# Patient Record
Sex: Female | Born: 1945 | Race: White | Hispanic: Yes | Marital: Married | Smoking: Former smoker
Health system: Southern US, Community
[De-identification: ages and names within clinical notes are randomized; demographics above are authoritative.]

## PROBLEM LIST (undated history)

## (undated) DIAGNOSIS — Z780 Asymptomatic menopausal state: Secondary | ICD-10-CM

## (undated) DIAGNOSIS — E785 Hyperlipidemia, unspecified: Secondary | ICD-10-CM

## (undated) DIAGNOSIS — J302 Other seasonal allergic rhinitis: Secondary | ICD-10-CM

## (undated) HISTORY — DX: Hyperlipidemia, unspecified: E78.5

## (undated) HISTORY — DX: Other seasonal allergic rhinitis: J30.2

## (undated) HISTORY — DX: Asymptomatic menopausal state: Z78.0

## (undated) HISTORY — PX: THYROIDECTOMY: SHX17

## (undated) HISTORY — PX: MASS EXCISION: SHX2000

---

## 1995-06-30 DIAGNOSIS — Z780 Asymptomatic menopausal state: Secondary | ICD-10-CM

## 1995-06-30 HISTORY — DX: Asymptomatic menopausal state: Z78.0

## 1998-01-22 ENCOUNTER — Ambulatory Visit (HOSPITAL_COMMUNITY): Admission: RE | Admit: 1998-01-22 | Discharge: 1998-01-22 | Payer: Self-pay | Admitting: Obstetrics

## 1999-01-30 ENCOUNTER — Ambulatory Visit (HOSPITAL_COMMUNITY): Admission: RE | Admit: 1999-01-30 | Discharge: 1999-01-30 | Payer: Self-pay | Admitting: Obstetrics

## 1999-07-31 ENCOUNTER — Encounter: Admission: RE | Admit: 1999-07-31 | Discharge: 1999-10-29 | Payer: Self-pay | Admitting: Gynecology

## 1999-12-19 ENCOUNTER — Other Ambulatory Visit: Admission: RE | Admit: 1999-12-19 | Discharge: 1999-12-19 | Payer: Self-pay | Admitting: Gynecology

## 2000-02-27 ENCOUNTER — Ambulatory Visit (HOSPITAL_COMMUNITY): Admission: RE | Admit: 2000-02-27 | Discharge: 2000-02-27 | Payer: Self-pay | Admitting: Obstetrics

## 2000-12-20 ENCOUNTER — Other Ambulatory Visit: Admission: RE | Admit: 2000-12-20 | Discharge: 2000-12-20 | Payer: Self-pay | Admitting: Gynecology

## 2001-03-18 ENCOUNTER — Encounter: Payer: Self-pay | Admitting: Gynecology

## 2001-03-18 ENCOUNTER — Ambulatory Visit (HOSPITAL_COMMUNITY): Admission: RE | Admit: 2001-03-18 | Discharge: 2001-03-18 | Payer: Self-pay | Admitting: Obstetrics

## 2002-02-13 ENCOUNTER — Other Ambulatory Visit: Admission: RE | Admit: 2002-02-13 | Discharge: 2002-02-13 | Payer: Self-pay | Admitting: Gynecology

## 2002-03-21 ENCOUNTER — Encounter: Payer: Self-pay | Admitting: Gynecology

## 2002-03-21 ENCOUNTER — Ambulatory Visit (HOSPITAL_COMMUNITY): Admission: RE | Admit: 2002-03-21 | Discharge: 2002-03-21 | Payer: Self-pay | Admitting: Gynecology

## 2003-02-19 ENCOUNTER — Other Ambulatory Visit: Admission: RE | Admit: 2003-02-19 | Discharge: 2003-02-19 | Payer: Self-pay | Admitting: Gynecology

## 2003-03-26 ENCOUNTER — Ambulatory Visit (HOSPITAL_COMMUNITY): Admission: RE | Admit: 2003-03-26 | Discharge: 2003-03-26 | Payer: Self-pay | Admitting: Gynecology

## 2003-03-26 ENCOUNTER — Encounter: Payer: Self-pay | Admitting: Gynecology

## 2004-03-10 ENCOUNTER — Other Ambulatory Visit: Admission: RE | Admit: 2004-03-10 | Discharge: 2004-03-10 | Payer: Self-pay | Admitting: Gynecology

## 2004-03-31 ENCOUNTER — Ambulatory Visit (HOSPITAL_COMMUNITY): Admission: RE | Admit: 2004-03-31 | Discharge: 2004-03-31 | Payer: Self-pay | Admitting: Gynecology

## 2005-03-11 ENCOUNTER — Other Ambulatory Visit: Admission: RE | Admit: 2005-03-11 | Discharge: 2005-03-11 | Payer: Self-pay | Admitting: Gynecology

## 2005-03-31 ENCOUNTER — Ambulatory Visit: Payer: Self-pay | Admitting: General Practice

## 2006-03-23 ENCOUNTER — Other Ambulatory Visit: Admission: RE | Admit: 2006-03-23 | Discharge: 2006-03-23 | Payer: Self-pay | Admitting: Gynecology

## 2006-04-14 ENCOUNTER — Ambulatory Visit: Payer: Self-pay | Admitting: General Practice

## 2006-07-20 ENCOUNTER — Ambulatory Visit: Payer: Self-pay | Admitting: Internal Medicine

## 2006-08-04 ENCOUNTER — Ambulatory Visit: Payer: Self-pay | Admitting: Internal Medicine

## 2007-01-06 ENCOUNTER — Ambulatory Visit: Payer: Self-pay | Admitting: Family Medicine

## 2007-01-06 DIAGNOSIS — M25519 Pain in unspecified shoulder: Secondary | ICD-10-CM

## 2007-03-28 ENCOUNTER — Encounter (INDEPENDENT_AMBULATORY_CARE_PROVIDER_SITE_OTHER): Payer: Self-pay | Admitting: Family Medicine

## 2007-03-28 ENCOUNTER — Other Ambulatory Visit: Admission: RE | Admit: 2007-03-28 | Discharge: 2007-03-28 | Payer: Self-pay | Admitting: Gynecology

## 2007-04-19 ENCOUNTER — Ambulatory Visit: Payer: Self-pay | Admitting: General Practice

## 2007-11-11 ENCOUNTER — Emergency Department (HOSPITAL_COMMUNITY): Admission: EM | Admit: 2007-11-11 | Discharge: 2007-11-11 | Payer: Self-pay | Admitting: Emergency Medicine

## 2007-11-17 ENCOUNTER — Encounter: Admission: RE | Admit: 2007-11-17 | Discharge: 2007-11-17 | Payer: Self-pay | Admitting: Orthopedic Surgery

## 2008-03-28 ENCOUNTER — Ambulatory Visit: Payer: Self-pay | Admitting: Gynecology

## 2008-03-28 ENCOUNTER — Encounter: Payer: Self-pay | Admitting: Gynecology

## 2008-03-28 ENCOUNTER — Other Ambulatory Visit: Admission: RE | Admit: 2008-03-28 | Discharge: 2008-03-28 | Payer: Self-pay | Admitting: Gynecology

## 2008-04-24 ENCOUNTER — Ambulatory Visit: Payer: Self-pay | Admitting: General Practice

## 2008-04-30 ENCOUNTER — Ambulatory Visit: Payer: Self-pay | Admitting: Gynecology

## 2009-04-23 ENCOUNTER — Ambulatory Visit: Payer: Self-pay | Admitting: Gynecology

## 2009-04-23 ENCOUNTER — Encounter: Payer: Self-pay | Admitting: Gynecology

## 2009-04-23 ENCOUNTER — Other Ambulatory Visit: Admission: RE | Admit: 2009-04-23 | Discharge: 2009-04-23 | Payer: Self-pay | Admitting: Gynecology

## 2009-04-30 ENCOUNTER — Ambulatory Visit: Payer: Self-pay | Admitting: General Practice

## 2009-05-03 ENCOUNTER — Ambulatory Visit: Payer: Self-pay | Admitting: Gynecology

## 2009-07-26 IMAGING — CR DG FOOT COMPLETE 3+V*L*
3 series · 3 of 3 positions shown · non-contrast
Comparison: None.

CLINICAL DATA: 7-year-old female with fall and left foot injury.
Severe pain.

LEFT FOOT - COMPLETE 3+ VIEW

[t foot ap left]
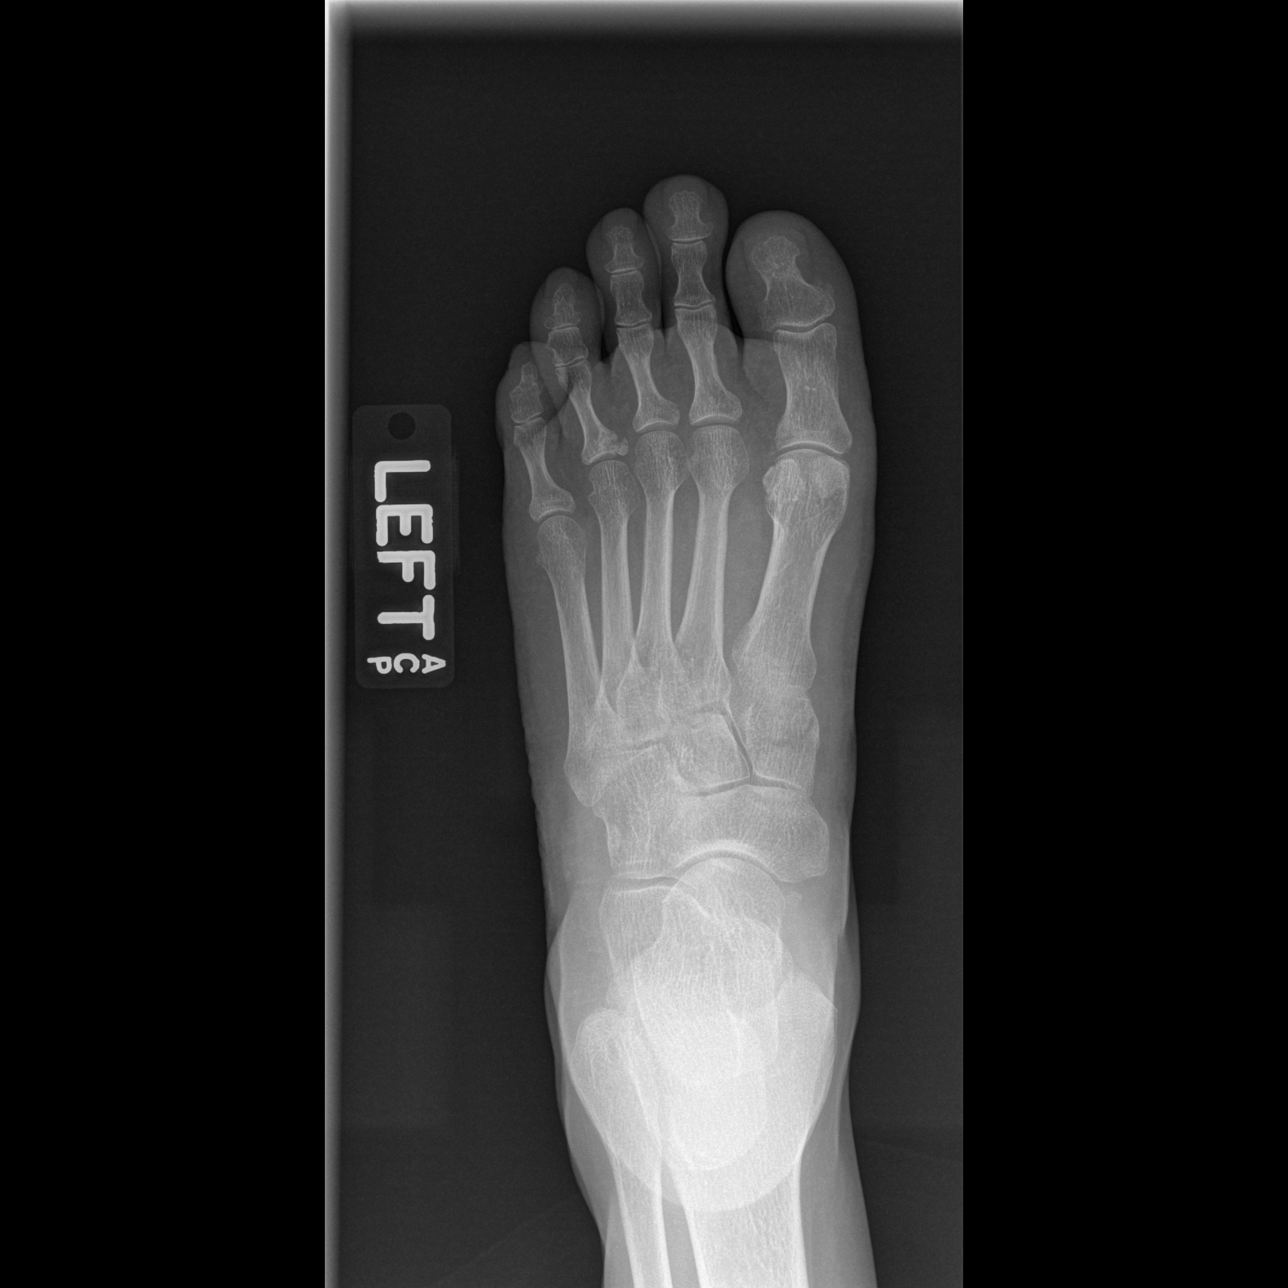

[t foot oblique left]
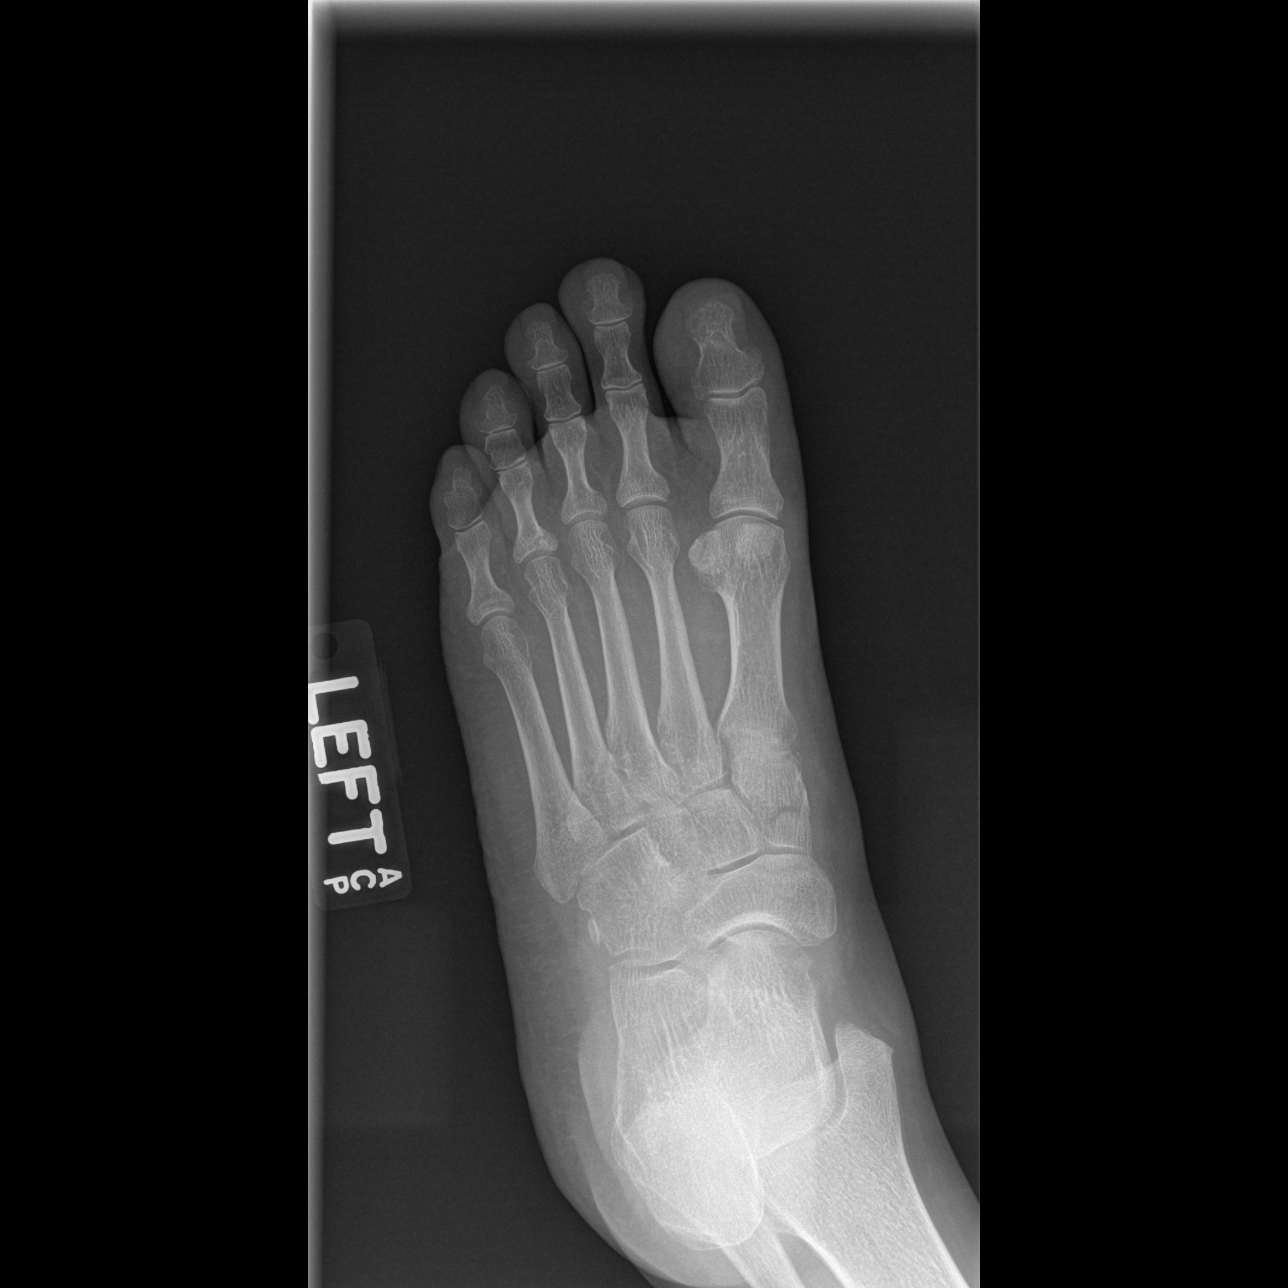

[t foot lat left]
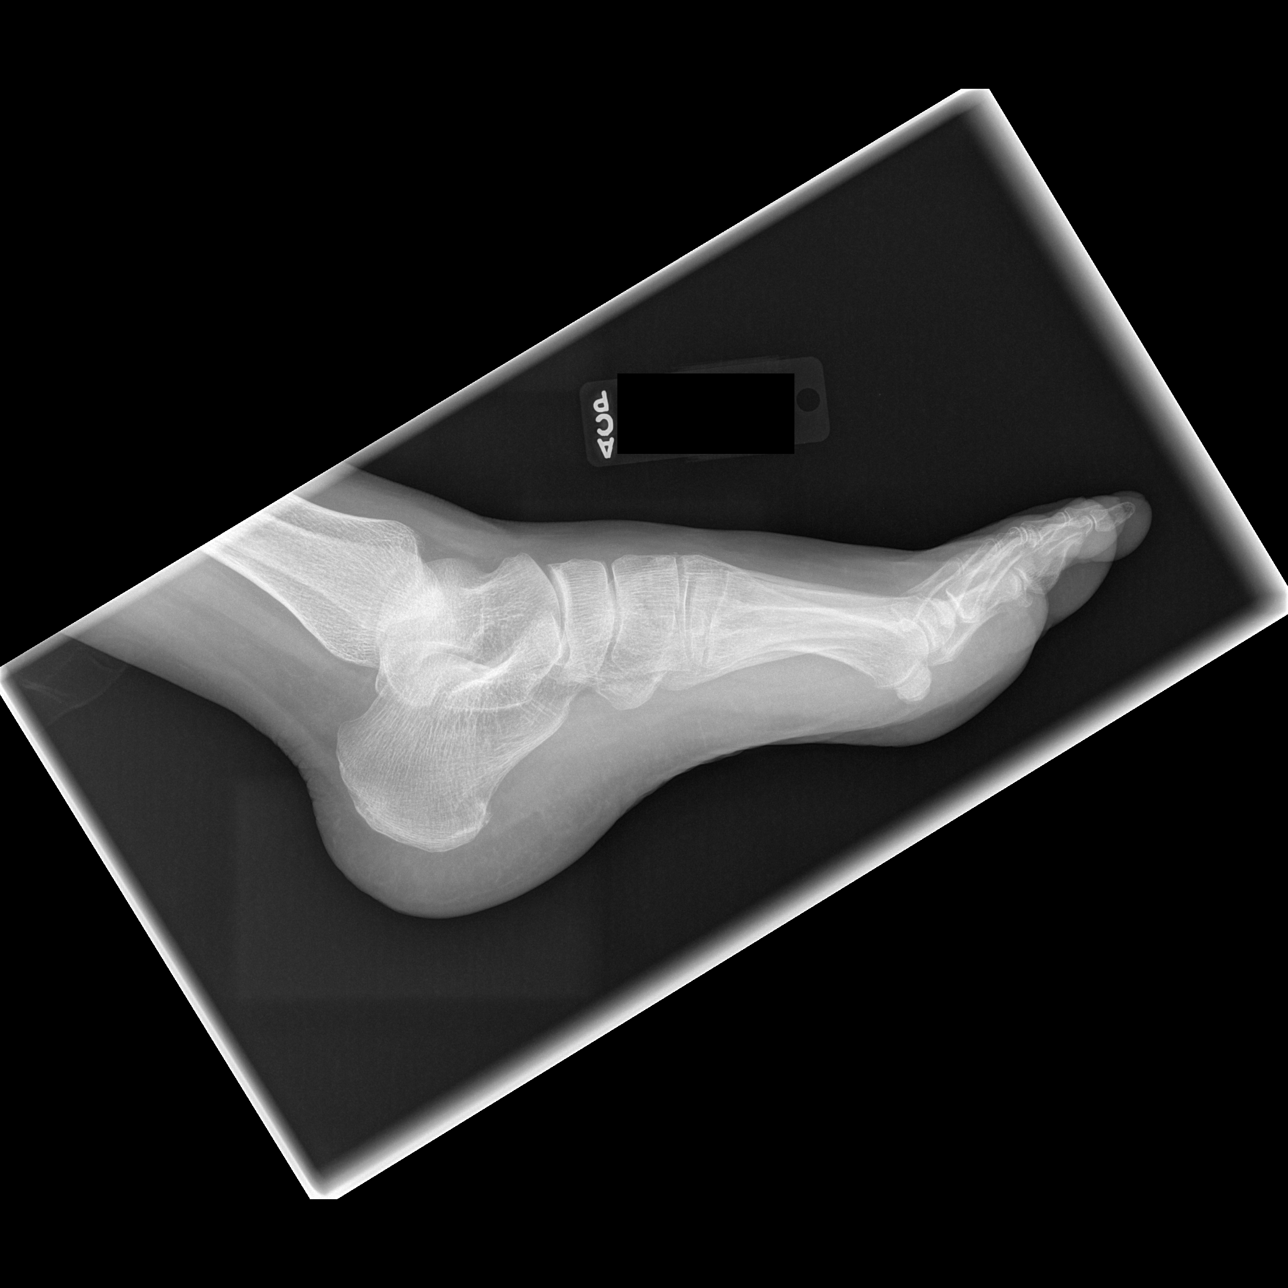

[3 of 3 positions shown; findings below may reference images not displayed]

FINDINGS: Mildly displaced fracture of the base of the fourth
proximal phalanx with intra-articular extension.  Suspicious
curvilinear lucency traverses the medial cuneiform on the frontal
and oblique views but does not appear to extend to the cortical
surface on either.  Incidental accessory ossicle adjacent to the
cuboid.  Calcaneus appears intact.  No additional fracture or
dislocation seen.
IMPRESSION: 1.  Mildly displaced fracture of the medial base of the fourth
proximal phalanx with intra-articular extension.
2.  Curvilinear lucency of the medial cuneiform does not reach the
cortical surface and is probably artifactual or physiologic.
Correlate for point tenderness in this region which if present
would suggest this is a nondisplaced fracture.

## 2009-08-01 IMAGING — CT CT EXTREM LOW W/O CM*L*
2 of 4 series · 4 of 14 positions shown, 5 images · non-contrast
Comparison: Plain films left foot 11/11/2007.

December 16, 2007 – Duplicate copy for exam association in [facility]. No change to original report.
CLINICAL DATA: Foot pain for 1 week after fall.

 CT LEFT FOOT.
TECHNIQUE: Multidetector CT imaging of the left foot was performed
 according to the standard protocol without intravenous contrast.
 Multiplanar CT image reconstructions were also generated.

[Series 4: left foot/detail · axial · 0.35mm/px · z∈[-214,-144]mm · 2 of 86 slices shown, 3 images]
[im 29/86  soft-tissue]
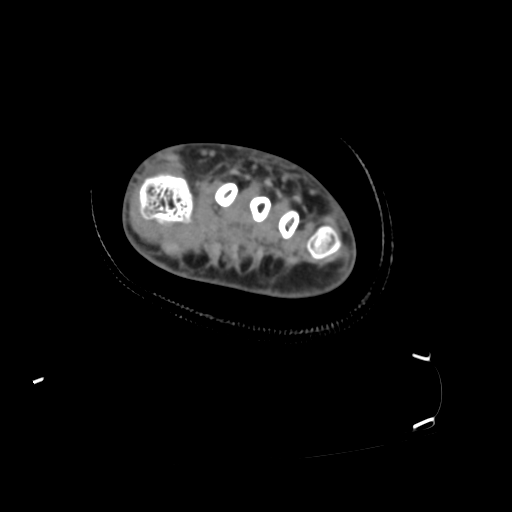
[im 29/86  bone]
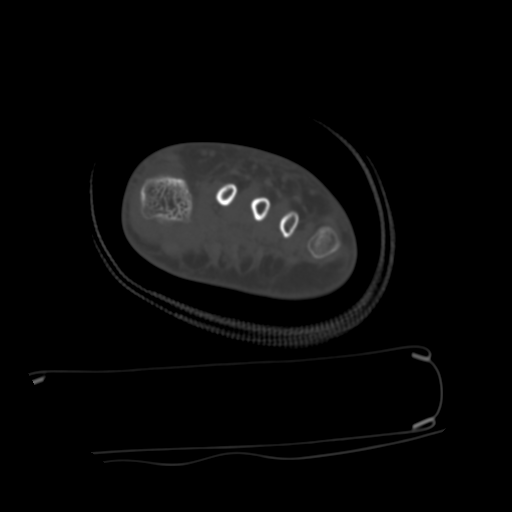
[im 57/86  bone]
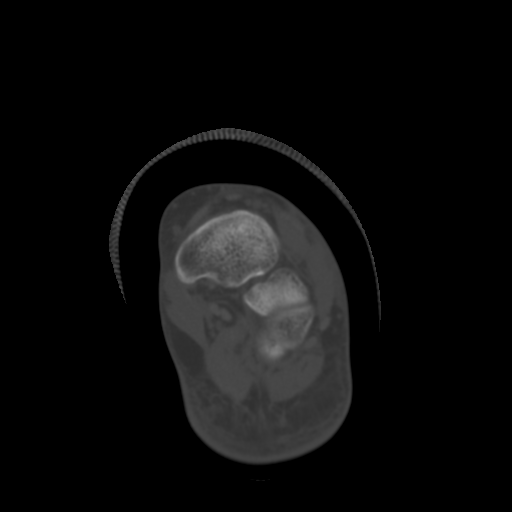

[Series 602: sagittal bone · sagittal · 0.42mm/px · 2 of 95 slices shown]
[im 32/95  bone]
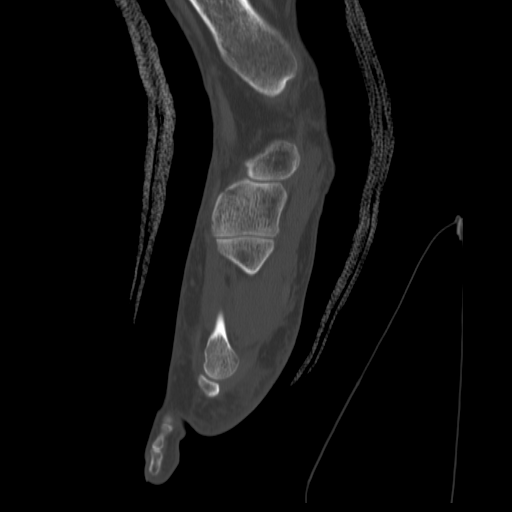
[im 63/95  bone]
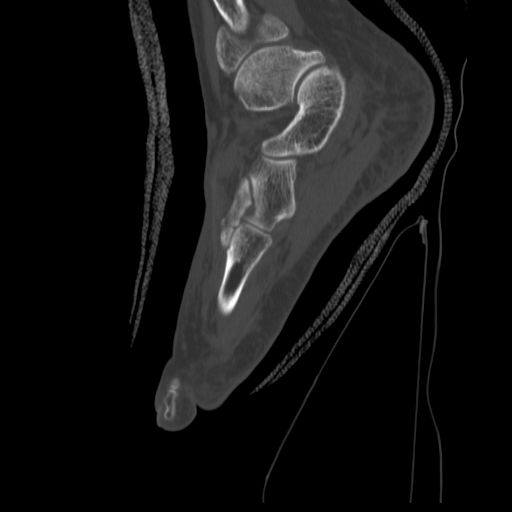

[4 of 14 positions shown; findings below may reference images not displayed]

FINDINGS: The patient has a nondisplaced fracture through the
 medial cuneiform. Fracture extends from the proximal aspect of the
 bone superiorly anteroinferiorly exiting at the inferior most
 margin of the articular surface of the medial cuneiform at its
 articulation of the first metatarsal.

 The patient also has a nondisplaced fracture through the base of
 the third metatarsal involving the medial and inferior cortex.
 Tiny avulsion fracture is also seen off the dorsal aspect of the
 base of the third metatarsal. This fracture is incomplete. No
 other discrete fracture is identified.

 There are small calcifications noted between the proximal shafts
 of the second and third metatarsals which are of doubtful clinical
 significance. No osteochondral lesion is seen the talar dome.
 Imaged muscles and tendons are unremarkable. There is some
 subcutaneous edema, which appears mild, over the dorsum of the
 foot.
IMPRESSION: 1. Fractures through the medial cuneiform and bases of the third
 and fourth metatarsals as described.

## 2010-03-26 ENCOUNTER — Ambulatory Visit: Payer: Self-pay | Admitting: Gynecology

## 2010-04-22 ENCOUNTER — Ambulatory Visit: Payer: Self-pay | Admitting: General Practice

## 2010-06-04 ENCOUNTER — Ambulatory Visit: Payer: Self-pay | Admitting: Gynecology

## 2010-06-12 ENCOUNTER — Ambulatory Visit: Payer: Self-pay | Admitting: Gynecology

## 2010-06-12 ENCOUNTER — Other Ambulatory Visit
Admission: RE | Admit: 2010-06-12 | Discharge: 2010-06-12 | Payer: Self-pay | Source: Home / Self Care | Admitting: Gynecology

## 2010-06-24 ENCOUNTER — Ambulatory Visit (HOSPITAL_COMMUNITY)
Admission: RE | Admit: 2010-06-24 | Discharge: 2010-06-24 | Payer: Self-pay | Source: Home / Self Care | Attending: Gynecology | Admitting: Gynecology

## 2010-09-19 ENCOUNTER — Ambulatory Visit (INDEPENDENT_AMBULATORY_CARE_PROVIDER_SITE_OTHER): Payer: BC Managed Care – PPO

## 2010-09-19 DIAGNOSIS — M810 Age-related osteoporosis without current pathological fracture: Secondary | ICD-10-CM

## 2010-09-19 DIAGNOSIS — J069 Acute upper respiratory infection, unspecified: Secondary | ICD-10-CM

## 2011-04-07 ENCOUNTER — Other Ambulatory Visit: Payer: Self-pay | Admitting: *Deleted

## 2011-04-07 DIAGNOSIS — M810 Age-related osteoporosis without current pathological fracture: Secondary | ICD-10-CM

## 2011-04-07 MED ORDER — DENOSUMAB 60 MG/ML ~~LOC~~ SOLN
60.0000 mg | Freq: Once | SUBCUTANEOUS | Status: AC
  Administered 2011-04-10: 60 mg via SUBCUTANEOUS

## 2011-04-10 ENCOUNTER — Ambulatory Visit (INDEPENDENT_AMBULATORY_CARE_PROVIDER_SITE_OTHER): Payer: BC Managed Care – PPO | Admitting: Anesthesiology

## 2011-04-10 DIAGNOSIS — M810 Age-related osteoporosis without current pathological fracture: Secondary | ICD-10-CM

## 2011-04-21 ENCOUNTER — Ambulatory Visit: Payer: Self-pay

## 2011-06-08 ENCOUNTER — Encounter: Payer: Self-pay | Admitting: Anesthesiology

## 2011-06-16 ENCOUNTER — Ambulatory Visit (INDEPENDENT_AMBULATORY_CARE_PROVIDER_SITE_OTHER): Payer: BC Managed Care – PPO | Admitting: Gynecology

## 2011-06-16 ENCOUNTER — Encounter: Payer: Self-pay | Admitting: Gynecology

## 2011-06-16 ENCOUNTER — Other Ambulatory Visit (HOSPITAL_COMMUNITY)
Admission: RE | Admit: 2011-06-16 | Discharge: 2011-06-16 | Disposition: A | Discharge status: Home / Self Care | Payer: BC Managed Care – PPO | Source: Ambulatory Visit | Attending: Gynecology | Admitting: Gynecology

## 2011-06-16 DIAGNOSIS — Z01419 Encounter for gynecological examination (general) (routine) without abnormal findings: Secondary | ICD-10-CM | POA: Insufficient documentation

## 2011-06-16 DIAGNOSIS — Z1211 Encounter for screening for malignant neoplasm of colon: Secondary | ICD-10-CM

## 2011-06-16 NOTE — Progress Notes (Signed)
Ashley Estrada 1946-05-08 [id number]   History:    65 y.o.  for annual exam with known history of osteoporosis. Patient was started on Prolia in November 2010 she did not get her second shot until March of 2012 and her third shot was in September of 2012. Her last bone density December 2011 demonstrated on significant decrease in the AP spine BMD but significant increase in the right and left femoral neck. Dr. Lerry Liner has been her primary physician which was treated for hypercholesterolemia for she will have been placed on Crestor although patient has a compliance issue and only took it for a few weeks. All her labs were drawn at work on October 29 which included a random blood sugar competence metabolic panel lipid profile thyroid function tests CBC all normal her hemoglobin A1c was 6.0. She is currently exercising. Last mammogram October 2011. Colonoscopy up-to-date.  Past medical history,surgical history, family history and social history were all reviewed and documented in the EPIC chart.  Gynecologic History No LMP recorded. Patient is postmenopausal. Contraception: none Last Pap: 2011. Results were: normal Last mammogram: 2012 Results were: normal  Obstetric History OB History    Grav Para Term Preterm Abortions TAB SAB Ect Mult Living   3 3 3       3      # Outc Date GA Lbr Len/2nd Wgt Sex Del Anes PTL Lv   1 TRM     M SVD  No Yes   2 TRM     M SVD  No Yes   3 TRM     F SVD  No Yes       ROS:  Was performed and pertinent positives and negatives are included in the history.  Exam: chaperone present  BP 120/78  Ht 4' 11.25" (1.505 m)  Wt 139 lb 8 oz (63.277 kg)  BMI 27.94 kg/m2  Body mass index is 27.94 kg/(m^2).  General appearance : Well developed well nourished female. No acute distress HEENT: Neck supple, trachea midline, no carotid bruits, no thyroidmegaly Lungs: Clear to auscultation, no rhonchi or wheezes, or rib retractions  Heart: Regular rate and  rhythm, no murmurs or gallops Breast:Examined in sitting and supine position were symmetrical in appearance, no palpable masses or tenderness,  no skin retraction, no nipple inversion, no nipple discharge, no skin discoloration, no axillary or supraclavicular lymphadenopathy Abdomen: no palpable masses or tenderness, no rebound or guarding Extremities: no edema or skin discoloration or tenderness  Pelvic:  Bartholin, Urethra, Skene Glands: Within normal limits             Vagina: No gross lesions or discharge  Cervix: No gross lesions or discharge  Uterus  anteverted, normal size, shape and consistency, non-tender and mobile  Adnexa  Without masses or tenderness  Anus and perineum  normal   Rectovaginal  normal sphincter tone without palpated masses or tenderness             Hemoccult cards presented for her to submit to the office at a later date  Patient had stated that she been placed on antibiotics recently for sinus infection but discontinued after a few days because an upset her stomach and she noted some blood in her stool but his been over a week she has had none of the symptoms. For this reason the fecal occult blood cords were provided for her to submit to the office later next week.     Assessment/Plan:  65 y.o. female for annual  exam unremarkable. Patient to continue to engage in weightbearing exercises to slow down her osteoporosis. She was reminded that in March she needs to return to the office for her next Prolia injection. She was reminded importance of calcium and vitamin D. I provided her with information on exercise and diet. Will follow with a bone density study next year. She declined flu vaccine. Will followup in one year or when necessary. Pap smear done today.     [name] MD, 9:46 AM 06/16/2011

## 2011-06-16 NOTE — Progress Notes (Signed)
Addended byCammie Mcgee T on: 06/16/2011 01:05 PM   Modules accepted: Orders

## 2011-06-16 NOTE — Patient Instructions (Signed)
Ejercicios para perder peso (Exercise to Lose Weight) La actividad fsica y una dieta saludable ayudan a perder peso. El mdico podr sugerirle ejercicios especficos. IDEAS Y CONSEJOS PARA HACER EJERCICIOS  Elija opciones econmicas que disfrute hacer , como caminar, andar en bicicleta o los vdeos para ejercitarse.   Utilice las escaleras en lugar del ascensor.   Camine durante la hora del almuerzo.   Estacione el auto lejos del lugar de trabajo o estudio.   Concurra a un gimnasio o tome clases de gimnasia.   Comience con 5  10 minutos de actividad fsica por da. Ejercite hasta 30 minutos, 4 a 6 das por semana.   Utilice zapatos que tengan un buen soporte y ropas cmodas.   Elongue antes y despus de ejercitar.   Ejercite hasta que aumente la respiracin y el corazn palpite rpido.   Beba agua extra cuando ejercite.   No haga ejercicio hasta lastimarse, sentirse mareado o que le falte mucho el aire.  La actividad fsica puede quemar alrededor de 150 caloras.  Correr 20 cuadras en 15 minutos.   Jugar vley durante 45 a 60 minutos.   Limpiar y encerar el auto durante 45 a 60 minutos.   Jugar ftbol americano de toque.   Caminar 25 cuadras en 35 minutos.   Empujar un cochecito 20 cuadras en 30 minutos.   Jugar baloncesto durante 30 minutos.   Rastrillar hojas secas durante 30 minutos.   Andar en bicicleta 80 cuadras en 30 minutos.   Caminar 30 cuadras en 30 minutos.   Bailar durante 30 minutos.   Quitar la nieve con una pala durante 15 minutos.   Nadar vigorosamente durante 20 minutos.   Subir escaleras durante 15 minutos.   Andar en bicicleta 60 cuadras durante 15 minutos.   Arreglar el jardn entre 30 y 45 minutos.   Saltar a la soga durante 15 minutos.   Limpiar vidrios o pisos durante 45 a 60 minutos.  Document Released: 09/19/2010 Document Revised: 02/25/2011 [employer] Patient Information 2012 [employer].                                                   Control del colesterol  Los niveles de colesterol en el organismo estn determinados significativamente por su dieta. Los niveles de colesterol tambin se relacionan con la enfermedad cardaca. El material que sigue ayuda a explicar esta relacin y a analizar qu puede hacer para mantener su corazn sano. No todo el colesterol es malo. Las lipoprotenas de baja densidad (LDL) forman el colesterol "malo". El colesterol malo puede ocasionar depsitos de grasa que se acumulan en el interior de las arterias. Las lipoprotenas de alta densidad (HDL) es el colesterol "bueno". Ayuda a remover el colesterol LDL "malo" de la sangre. El colesterol es un factor de riesgo muy importante para la enfermedad cardaca. Otros factores de riesgo son la hipertensin arterial, el hbito de fumar, el estrs, la herencia y el peso.   El msculo cardaco obtiene el suministro de sangre a travs de las arterias coronarias. Si su colesterol LDL ("malo") est elevado y el HDL ("bueno") es bajo, tiene un factor de riesgo para que se formen depsitos de grasa en las arterias coronarias (los vasos sanguneos que suministran sangre al corazn). Esto hace que haya menos lugar para que la sangre circule. Sin la suficiente sangre y   oxgeno, el msculo cardaco no puede funcionar correctamente, y usted podr sentir dolores en el pecho (angina pectoris). Cuando una arteria coronaria se cierra completamente, una parte del msculo cardaco puede morir (infarto de miocardio).  CONTROL DEL COLESTEROL Cuando el profesional que lo asiste enva la sangre al laboratorio para conocer el nivel de colesterol, puede realizarle tambin un perfil completo de los lpidos. Con esta prueba, se puede determinar la cantidad total de colesterol, as como los niveles de LDL y HDL. Los triglicridos son un tipo de grasa que circula en la sangre y que tambin puede utilizarse para determinar el riesgo de enfermedad cardaca. En la siguiente tabla se  establecen los nmeros ideales: Prueba: Colesterol total  Menos de 200 mg/dl.  Prueba: LDL "colesterol malo"  Menos de 100 mg/dl.   Menos de 70 mg/dl si tiene riesgo muy elevado de sufrir un ataque cardaco o muerte cardaca sbita.  Prueba: HDL "colesterol bueno"  Mujeres: Ms de 50 mg/dl.   Hombres: Ms de 40 mg/dl.  Prueba: Trigliceridos  Menos de 150 mg/dl.    CONTROL DEL COLESTEROL CON DIETA Aunque factores como el ejercicio y el estilo de vida son importantes, la "primera lnea de ataque" es la dieta. Esto se debe a que se sabe que ciertos alimentos hacen subir el colesterol y otros lo bajan. El objetivo debe ser equilibrar los alimentos, de modo que tengan un efecto sobre el colesterol y, an ms importante, reemplazar las grasas saturadas y trans con otros tipos de grasas, como las monoinsaturadas y las poliinsaturadas y cidos grasos omega-3 . En promedio, una persona no debe consumir ms de 15 a 17 g de grasas saturadas por da. Las grasas saturadas y trans se consideran grasas "malas", ya que elevan el colesterol LDL. Las grasas saturadas se encuentran principalmente en productos animales como carne, manteca y crema. Pero esto no significa que usted debe sacrificar todas sus comidas favoritas. Actualmente, como lo muestra el cuadro que figura al final de este documento, hay sustitutos de buen sabor, bajos en grasas y en colesterol, para la mayora de los alimentos que a usted le gusta comer. Elija aquellos alimentos alternativos que sean bajos en grasas o sin grasas. Elija cortes de carne del cuarto trasero o lomo ya que estos cortes son los que tienen menor cantidad de grasa y colesterol. El pollo (sin piel), el pescado, la carne de ternera, y la pechuga de pavo molida son excelentes opciones. Elimine las carnes grasosas como los hotdogs o el salami. Los mariscos tienen poco o nada de grasas saturadas. Cuando consuma carne magra, carne de aves de corral, o pescado, hgalo en  porciones de 85 gramos (3 onzas). Las grasas trans tambin se llaman "aceites parcialmente hidrogenados". Son aceites manipulados cientficamente de modo que son slidos a temperatura ambiente, tienen una larga vida y mejoran el sabor y la textura de los alimentos a los que se agregan. Las grasas trans se encuentran en la margarina, masitas, crackers y alimentos horneados.  Para hornear y cocinar, el aceite es un excelente sustituto para la mantequilla. Los aceites monoinsaturados tienen un beneficio particular, ya que se cree que disminuyen el colesterol LDL (colesterol malo) y elevan el HDL. Deber evitar los aceites tropicales saturados como el de coco y el de palma.  Recuerde, adems, que puede comer sin restricciones los grupos de alimentos que son naturalmente libres de grasas saturadas y grasas trans, entre los que se incluyen el pescado, las frutas (excepto el aguacate), verduras, frijoles, cereales (cebada, arroz,   cuzcuz, trigo) y las pastas (sin salsas con crema)   IDENTIFIQUE LOS ALIMENTOS QUE DISMINUYEN EL COLESTEROL  Pueden disminuir el colesterol las fibras solubles que estn en las frutas, como las manzanas, en los vegetales como el brcoli, las patatas y las zanahorias; en las legumbres como frijoles, guisantes y lentejas; y en los cereales como la cebada. Los alimentos fortificados con fitosteroles tambin pueden disminuir el colesterol. Debe consumir al menos 2 g de estos alimentos a diario para obtener el efecto de disminucin de colesterol.  En el supermercado, lea las etiquetas de los envases para identificar los alimentos bajos en grasas saturadas, libres de grasas trans y bajos en grasas, . Elija quesos que tengan solo de 2 a 3 g de grasa saturada por onza (28,35 g). Use una margarina que no dae el corazn, libre de grasas trans o aceite parcialmente hidrogenado. Al comprar alimentos horneados (galletitas dulces y galletas) evite el aceite parcialmente hidrogenado. Los panes y bollos  debern ser de granos enteros (harina de maz o de avena entera, en lugar de "harina" o "harina enriquecida"). Compre sopas en lata que no sean cremosas, con bajo contenido de sal y sin grasas adicionadas.   TCNICAS DE PREPARACIN DE LOS ALIMENTOS  Nunca fra los alimentos en aceite abundante. Si debe frer, hgalo en poco aceite y removiendo constantemente, porque as se utilizan muy pocas grasas, o utilice un spray antiadherente. Cuando le sea posible, hierva, hornee o ase las carnes y cocine los vegetales al vapor. En vez de aderezar los vegetales con mantequilla o margarina, utilice limn y hierbas, pur de manzanas y canela (para las calabazas y batatas), yogurt y salsa descremados y aderezos para ensaladas bajos en contenido graso.   BAJO EN GRASAS SATURADAS / SUSTITUTOS BAJOS EN GRASA  Carnes / Grasas saturadas (g)  Evite: Bife, corte graso (3 oz/85 g) / 11 g   Elija: Bife, corte magro (3 oz/85 g) / 4 g   Evite: Hamburguesa (3 oz/85 g) / 7 g   Elija:  Hamburguesa magra (3 oz/85 g) / 5 g   Evite: Jamn (3 oz/85 g) / 6 g   Elija:  Jamn magro (3 oz/85 g) / 2.4 g   Evite: Pollo, con piel (3 oz/85 g), Carne oscura / 4 g   Elija:  Pollo, sin piel (3 oz/85 g), Carne oscura / 2 g   Evite: Pollo, con piel (3 oz/85 g), Carne magra / 2.5 g   Elija: Pollo, sin piel (3 oz/85 g), Carne magra / 1 g  Lcteos / Grasas saturadas (g)  Evite: Leche entera (1 taza) / 5 g   Elija: Leche con bajo contenido de grasa, 2% (1 taza) / 3 g   Elija: Leche con bajo contenido de grasa, 1% (1 taza) / 1.5 g   Elija: Leche descremada (1 taza) / 0.3 g   Evite: Queso duro (1 oz/28 g) / 6 g   Elija: Queso descremado (1 oz/28 g) / 2-3 g   Evite: Queso cottage, 4% grasa (1 taza)/ 6.5 g   Elija: Queso cottage con bajo contenido de grasa, 1% grasa (1 taza)/ 1.5 g   Evite: Helado (1 taza) / 9 g   Elija: Sorbete (1 taza) / 2.5 g   Elija: Yogurt helado sin contenido de grasa (1 taza) / 0.3 g   Elija:  Barras de fruta congeladas / vestigios   Evite: Crema batida (1 cucharada) / 3.5 g   Elija: Batidos glac sin lcteos (1 cucharada) /   1 g  Condimentos / Grasas saturadas (g)  Evite: Mayonesa (1 cucharada) / 2 g   Elija: Mayonesa con bajo contenido de grasa (1 cucharada) / 1 g   Evite: Manteca (1 cucharada) / 7 g   Elija: Margarina extra light (1 cucharada) / 1 g   Evite: Aceite de coco (1 cucharada) / 11.8 g   Elija: Aceite de oliva (1 cucharada) / 1.8 g   Elija: Aceite de maz (1 cucharada) / 1.7 g   Elija: Aceite de crtamo (1 cucharada) / 1.2 g   Elija: Aceite de girasol (1 cucharada) / 1.4 g   Elija: Aceite de soja (1 cucharada) / 2.4 g   Elija: Aceite de canola (1 cucharada) / 1 g  Document Released: 06/15/2005 Document Revised: 02/25/2011 [employer] Patient Information 2012 [employer].  

## 2011-10-01 ENCOUNTER — Telehealth: Payer: Self-pay | Admitting: *Deleted

## 2011-10-01 NOTE — Telephone Encounter (Signed)
prolia benefits requested kw

## 2011-10-13 ENCOUNTER — Telehealth: Payer: Self-pay | Admitting: *Deleted

## 2011-10-13 NOTE — Telephone Encounter (Signed)
Attempted to call patient about Prolia information.  Phone number is not in service and em contact number is too.  Will send out a letter to patient.

## 2011-10-15 NOTE — Telephone Encounter (Signed)
Ashley Estrada started another encounter once benefits came back see that encounter

## 2012-01-01 ENCOUNTER — Ambulatory Visit (INDEPENDENT_AMBULATORY_CARE_PROVIDER_SITE_OTHER): Payer: BC Managed Care – PPO | Admitting: Emergency Medicine

## 2012-01-01 VITALS — BP 140/72 | HR 61 | Temp 98.2°F | Resp 16 | Ht 59.0 in | Wt 140.0 lb

## 2012-01-01 DIAGNOSIS — R300 Dysuria: Secondary | ICD-10-CM

## 2012-01-01 DIAGNOSIS — N390 Urinary tract infection, site not specified: Secondary | ICD-10-CM

## 2012-01-01 DIAGNOSIS — R1084 Generalized abdominal pain: Secondary | ICD-10-CM

## 2012-01-01 LAB — POCT UA - MICROSCOPIC ONLY

## 2012-01-01 LAB — POCT URINALYSIS DIPSTICK
Bilirubin, UA: NEGATIVE
Glucose, UA: NEGATIVE
Ketones, UA: NEGATIVE

## 2012-01-01 MED ORDER — CIPROFLOXACIN HCL 250 MG PO TABS: 250.0000 mg | ORAL_TABLET | Freq: Two times a day (BID) | ORAL | Status: AC

## 2012-01-01 NOTE — Patient Instructions (Addendum)
Infeccin del tracto urinario (Urinary Tract Infection) Las infecciones en el tracto urinario pueden comenzar en varios lugares. Una infeccin en la vejiga (cistitis), una infeccin en el rin (pielonefritis) o una infeccin en la prstata (prostatitis) son diferentes tipos de infeccin del tracto urinario. Por lo general mejoran si se los trata con antibiticos. Los antibiticos son medicamentos que matan grmenes. Tome todos los medicamentos que le han recetado hasta que se terminen. Podr sentirse bien dentro de unos das, pero DEBE TOMAR LOS MEDICAMENTOS HASTA TERMINAR EL TRATAMIENTO, de lo contrario la infeccin puede no solucionarse y luego ser ms difcil de tratar. INSTRUCCIONES PARA EL CUIDADO DOMICILIARIO  Beba gran cantidad de lquidos para mantener la orina de tono claro o color amarillo plido. Se recomienda especialmente el jugo de arndanos rojos, adems de grandes cantidades de agua.   Evite la cafena, el t y las bebidas con gas. Estas sustancias irritan la vejiga.   El alcohol puede irritar la prstata.   Utilice los medicamentos de venta libre o de prescripcin para el dolor, el malestar o la fiebre, segn se lo indique el profesional que lo asiste.  PARA PREVENIR FUTURAS INFECCIONES:  Vace la vejiga con frecuencia. Evite retener la orina durante largos perodos.   Despus de mover el intestino, las mujeres deben higienizarse la regin perineal desde adelante hacia atrs. Use cada papel tissue slo una vez.   Vace la vejiga antes y despus de tener relaciones sexuales.  OBTENER LOS RESULTADOS DE LAS PRUEBAS Durante su visita no contar con todos los resultados de los anlisis. En este caso, tenga otra entrevista con su mdico para conocerlos. No piense que el resultado es normal si no tiene noticias de su mdico o de la institucin mdica. Es importante el seguimiento de todos los resultados de los anlisis.  SOLICITE ATENCIN MDICA SI:  Siente dolor en la espalda.    El beb tiene ms de 3 meses y su temperatura rectal es de 100.5 F (38.1 C) o ms durante ms de 1 da.   Los problemas (sntomas) no mejoran en 3 das. Solicite atencin mdica antes si empeora.  SOLICITE ATENCIN MDICA DE INMEDIATO SI:  Comienza a sentir un dolor de espaldas o en la zona abdominal inferior intenso.   Comienza a sentir escalofros.   Tiene fiebre.   Su beb tiene ms de 3 meses y su temperatura rectal es de 102 F (38.9 C) o mayor.   Su beb tiene 3 meses o menos y su temperatura rectal es de 100.4 F (38 C) o mayor.   Siente nuseas o vmitos.   Tiene una sensacin continua de quemazn o molestias al orinar.  EST SEGURO QUE:  Comprende las instrucciones para el alta mdica.   Controlar su enfermedad.   Solicitar atencin mdica de inmediato segn las indicaciones.  Document Released: 03/25/2005 Document Revised: 06/04/2011 [employer] Patient Information 2012 [employer]. 

## 2012-01-01 NOTE — Progress Notes (Signed)
  Subjective:    Patient ID: Ashley Estrada, female    DOB: 30-Nov-1945, 66 y.o.   MRN: [id number]  HPI enters with onset yesterday of severe discomfort in her lower abdomen. She states it hurts for her to urinate. She states it also is difficult for her to urinate. She denies any vaginal symptoms    Review of Systems     Objective:   Physical Exam patient is alert cooperative does not appear in any distress. Her chest is clear to auscultation and percussion. The abdomen is flat there is tenderness in the suprapubic area. There is no rebound noted.  Results for orders placed in visit on 01/01/12  POCT UA - MICROSCOPIC ONLY      Component Value Range   WBC, Ur, HPF, POC tntc     RBC, urine, microscopic 7-12     Bacteria, U Microscopic 1+     Mucus, UA neg     Epithelial cells, urine per micros 1-2     Crystals, Ur, HPF, POC neg     Casts, Ur, LPF, POC neg     Yeast, UA neg    POCT URINALYSIS DIPSTICK      Component Value Range   Color, UA pale     Clarity, UA clear     Glucose, UA neg     Bilirubin, UA neg     Ketones, UA neg     Spec Grav, UA 1.010     Blood, UA mod     pH, UA 7.0     Protein, UA neg     Urobilinogen, UA 0.2     Nitrite, UA neg     Leukocytes, UA moderate (2+)          Assessment & Plan:  Patient has pyuria and hematuria. We'll treat with Cipro x7 days. Urine culture was done.

## 2012-01-03 LAB — URINE CULTURE: Colony Count: >=100000

## 2012-04-19 ENCOUNTER — Ambulatory Visit: Payer: Self-pay

## 2012-06-17 ENCOUNTER — Ambulatory Visit (INDEPENDENT_AMBULATORY_CARE_PROVIDER_SITE_OTHER): Payer: BC Managed Care – PPO | Admitting: Gynecology

## 2012-06-17 ENCOUNTER — Encounter: Payer: Self-pay | Admitting: Gynecology

## 2012-06-17 VITALS — BP 120/80 | Ht 59.0 in | Wt 136.0 lb

## 2012-06-17 DIAGNOSIS — M810 Age-related osteoporosis without current pathological fracture: Secondary | ICD-10-CM | POA: Insufficient documentation

## 2012-06-17 DIAGNOSIS — Z01419 Encounter for gynecological examination (general) (routine) without abnormal findings: Secondary | ICD-10-CM

## 2012-06-17 DIAGNOSIS — I83893 Varicose veins of bilateral lower extremities with other complications: Secondary | ICD-10-CM

## 2012-06-17 DIAGNOSIS — E785 Hyperlipidemia, unspecified: Secondary | ICD-10-CM | POA: Insufficient documentation

## 2012-06-17 DIAGNOSIS — I83812 Varicose veins of left lower extremities with pain: Secondary | ICD-10-CM | POA: Insufficient documentation

## 2012-06-17 LAB — LIPID PANEL
Total CHOL/HDL Ratio: 4.5 Ratio
VLDL: 29 mg/dL (ref 0–40)

## 2012-06-17 NOTE — Progress Notes (Signed)
Ashley Estrada 1945-07-30 [id number]   History:    66 y.o.  for annual gyn exam who has history of osteoporosis. Review of her record indicated she was started on Prolia in 2010 but has not been getting her injections every 6 months as has previously been prescribed. The following is the schedule of her Prolia injections: November 2010 March 2012 September 2012 2013 none There appears to be a communication between her in our office we have sent written communication and she is tried to contact the office and has not been successful.  Her last bone density December 2011 demonstrated on significant decrease in the AP spine BMD but significant increase in the right and left femoral neck. Dr. Lerry Liner has been her primary physician which was treated for hypercholesterolemia for she will have been placed on Crestor. She had her lab work done recently from her work and they had switched her Crestor 297 along with fish well daily. It was noted that her LDL was elevated at 151 and her total cholesterol 235 and the rest of the lipid profile was normal.  Her last mammogram was this year which was normal. She does her monthly self breast examination. She's been complaining of right lower extremity calf discomfort due to her severe varicosities.   Past medical history,surgical history, family history and social history were all reviewed and documented in the EPIC chart.  Gynecologic History No LMP recorded. Patient is postmenopausal. Contraception: post menopausal status Last Pap: 2012. Results were: normal Last mammogram: 2013. Results were: normal  Obstetric History OB History    Grav Para Term Preterm Abortions TAB SAB Ect Mult Living   3 3 3       3      # Outc Date GA Lbr Len/2nd Wgt Sex Del Anes PTL Lv   1 TRM     M SVD  No Yes   2 TRM     M SVD  No Yes   3 TRM     F SVD  No Yes       ROS: A ROS was performed and pertinent positives and negatives are included in the history.  GENERAL: No fevers or chills. HEENT: No change in vision, no earache, sore throat or sinus congestion. NECK: No pain or stiffness. CARDIOVASCULAR: No chest pain or pressure. No palpitations. PULMONARY: No shortness of breath, cough or wheeze. GASTROINTESTINAL: No abdominal pain, nausea, vomiting or diarrhea, melena or bright red blood per rectum. GENITOURINARY: No urinary frequency, urgency, hesitancy or dysuria. MUSCULOSKELETAL: No joint or muscle pain, no back pain, no recent trauma. DERMATOLOGIC: No rash, no itching, no lesions. ENDOCRINE: No polyuria, polydipsia, no heat or cold intolerance. No recent change in weight. HEMATOLOGICAL: No anemia or easy bruising or bleeding. NEUROLOGIC: No headache, seizures, numbness, tingling or weakness. PSYCHIATRIC: No depression, no loss of interest in normal activity or change in sleep pattern. Complaint of right leg cramps attributed to her painful varicosities.    Exam: chaperone present  BP 120/80  Ht 4\' 11"  (1.499 m)  Wt 136 lb (61.689 kg)  BMI 27.47 kg/m2  Body mass index is 27.47 kg/(m^2).  General appearance : Well developed well nourished female. No acute distress HEENT: Neck supple, trachea midline, no carotid bruits, no thyroidmegaly Lungs: Clear to auscultation, no rhonchi or wheezes, or rib retractions  Heart: Regular rate and rhythm, no murmurs or gallops Breast:Examined in sitting and supine position were symmetrical in appearance, no palpable masses or tenderness,  no skin retraction,  no nipple inversion, no nipple discharge, no skin discoloration, no axillary or supraclavicular lymphadenopathy Abdomen: no palpable masses or tenderness, no rebound or guarding Extremities: no edema or skin discoloration or tenderness. A right lower leg varicosities (calf)  Pelvic:  Bartholin, Urethra, Skene Glands: Within normal limits             Vagina: No gross lesions or discharge, atrophic changes  Cervix: No gross lesions or discharge  Uterus   axial, normal size, shape and consistency, non-tender and mobile  Adnexa  Without masses or tenderness  Anus and perineum  normal   Rectovaginal  normal sphincter tone without palpated masses or tenderness             Hemoccult cards provided   Colonoscopy 8 years ago negative  Assessment/Plan:  66 y.o. female for annual exam with history of osteoporosis compliance issue with Prolia. She will be schedule for her Prolia next week. We'll need to sit down with her and explained that she needs to come by the office every 6 months since we're having difficulty communicating with her if on and bimanual. She will schedule a bone density study in the next couple weeks. The following labs will be ordered: Fasting lipid profile, SGOT and SGPT to see how she is responding since they switch her to niacin 3 months ago. Her urinalysis also be done as well as vitamin D level. She was instructed to continue to take her calcium and vitamin D as well as regular exercise. We'll wait for the above results and manage accordingly. She'll be referred to the vein specialist for possible treatment for her severe right lower extremity varicosities.    [name] MD, 9:37 AM 06/17/2012

## 2012-06-17 NOTE — Patient Instructions (Addendum)
Bone Densitometry Bone densitometry is a special X-ray that measures your bone density and can be used to help predict your risk of bone fractures. This test is used to determine bone mineral content and density to diagnose osteoporosis. Osteoporosis is the loss of bone that may cause the bone to become weak. Osteoporosis commonly occurs in women entering menopause. However, it may be found in men and in people with other diseases. PREPARATION FOR TEST No preparation necessary. WHO SHOULD BE TESTED?  All women older than 66.  Postmenopausal women (50 to 10) with risk factors for osteoporosis.  People with a previous fracture caused by normal activities.  People with a small body frame (less than 127 poundsor a body mass index [BMI] of less than 21).  People who have a parent with a hip fracture or history of osteoporosis.  People who smoke.  People who have rheumatoid arthritis.  Anyone who engages in excessive alcohol use (more than 3 drinks most days).  Women who experience early menopause. WHEN SHOULD YOU BE RETESTED? Current guidelines suggest that you should wait at least 2 years before doing a bone density test again if your first test was normal.Recent studies indicated that women with normal bone density may be able to wait a few years before needing to repeat a bone density test. You should discuss this with your caregiver.  NORMAL FINDINGS   Normal: less than standard deviation below normal (greater than -1).  Osteopenia: 1 to 2.5 standard deviations below normal (-1 to -2.5).  Osteoporosis: greater than 2.5 standard deviations below normal (less than -2.5). Test results are reported as a "T score" and a "Z score."The T score is a number that compares your bone density with the bone density of healthy, young women.The Z score is a number that compares your bone density with the scores of women who are the same age, gender, and race.  Ranges for normal findings may vary  among different laboratories and hospitals. You should always check with your doctor after having lab work or other tests done to discuss the meaning of your test results and whether your values are considered within normal limits. MEANING OF TEST  Your caregiver will go over the test results with you and discuss the importance and meaning of your results, as well as treatment options and the need for additional tests if necessary. OBTAINING THE TEST RESULTS It is your responsibility to obtain your test results. Ask the lab or department performing the test when and how you will get your results. Document Released: 07/07/2004 Document Revised: 09/07/2011 Document Reviewed: 07/30/2010 [facility] Patient Information 2013 [place], [place]. Osteoporosis (Osteoporosis)  A lo largo de la vida, el cuerpo elimina el tejido viejo de los [place] y lo reemplaza por tejido nuevo. A medida que se envejece, el cuerpo no puede reponerlo tan rpidamente como lo elimina. Alrededor [employer] 30 aos, la mayora de las personas comienza a perder poco a poco el tejido de los huesos debido al desequilibrio entre la prdida y la reposicin. Algunas personas pierden ms tejido [employer]. La prdida del tejido del hueso ms all de un grado normal se considera osteoporosis.  La osteoporosis afecta la resistencia y la durabilidad de los [place]. El interior de los extremos de los huesos y los huesos planos, como los huesos de la pelvis, se parecen a un panal porque estn llenos de pequeos espacios abiertos. Al perder tejido los huesos se vuelven menos densos. Esto significa que los espacios abiertos  se hacen ms grandes y las paredes entre estos espacios se vuelven ms delgadas. Como consecuencia, los huesos se vuelven ms dbiles. Los huesos de una persona que sufre osteoporosis llegan a ser tan dbiles que pueden romperse (fractura) en un accidente leve, como una simple cada.  CAUSAS  Los siguientes factores han sido asociados  con el desarrollo de la osteoporosis.   El hbito de fumar.  Beber ms de 2 medidas de bebidas [employer] a la [place].  Uso de ciertos medicamentos durante un tiempo prolongado.  Corticoides.  Medicamentos para la quimioterapia.  Medicamentos para la tiroides.  Medicamentos antiepilpticos.  Medicamentos de supresin gonadal.  Medicamentos inmunosupresores.  Tener bajo peso.  Falta de actividad fsica.  Falta de exposicin al sol. Esto puede ser la causa del dficit de vitamina D.  Ciertas enfermedades crnicas.  Ciertas enfermedades inflamatorias del intestino, como la enfermedad de Crohn y la colitis ulcerosa.  Diabetes.  Hipertiroidismo.  Hiperparatiroidismo. FACTORES DE RIESGO  Cualquier persona puede desarrollar osteoporosis. Sin embargo, los siguientes factores pueden aumentar el riesgo de Environmental education officer osteoporosis.   El sexo. La mujeres tiene ms [employer].  La edad. Tener ms de 50 aos aumenta el riesgo.  La etnia. Las personas de raza blanca y asitica tienen ms riesgo.  Antecedentes familiares de osteoporosis. Tener un miembro en la familia que haya sufrido osteoporosis hace que aumente el [place]. SNTOMAS  Generalmente las personas que sufren osteoporosis no tienen sntomas.  DIAGNSTICO  Algunos signos hallados durante un examen fsico que pueden hacer sospechar al mdico que sufre osteoporosis son:   Disminucin de Print production planner. La causa en general es la compresin de los huesos que forman la columna vertebral (vrtebras), que se han debilitado y se han fracturado.  Una curva o redondeo de la espalda (cifosis). Para confirmar los signos de osteoporosis, el mdico puede solicitar un procedimiento que [place] 2 haces de rayos X en dosis bajas, con diferentes niveles de energa para medir la densidad mineral sea (absorciometra de rayos X de energa dual [DXA]). Adems, el mdico controlar su nivel de vitamina D.  TRATAMIENTO    El objetivo del tratamiento de la osteoporosis es el fortalecimiento de los huesos con el fin de disminuir el riesgo de fracturas. Hay diferentes tipos de medicamentos disponibles para ayudar a Armed forces technical officer. Algunos de estos medicamentos actan reduciendo [employer] de prdida sea. Otros medicamentos funcionan aumentando la densidad sea. El tratamiento tambin consiste en asegurarse de que sus niveles de calcio y vitamina D son adecuados.  PREVENCIN  Hay cosas que usted puede hacer para prevenir la osteoporosis. Un consumo adecuado de calcio y vitamina D puede ayudar a Personnel officer una ptima densidad mineral sea. El ejercicio regular tambin puede ayudar, sobre todo la resistencia y actividades de alto impacto. Si usted fuma, dejar de fumar es una parte importante de la prevencin de la osteoporosis.  ASEGRESE DE QUE:   Comprende estas instrucciones.  Controlar su enfermedad.  Solicitar ayuda de inmediato si no mejora o si empeora. Document Released: 03/25/2005 Document Revised: 09/07/2011 [facility] Patient Information 2013 [place], [place]. Denosumab injection Qu es este medicamento? El DENOSUMAB desacelera la descomposicin de los [place]. Se utiliza para tratar la osteoporosis en mujeres despus de la menopausia. Este medicamento se [place] tambin para prevenir fracturas de huesos y otros problemas de huesos provocados por la metstasis en el cncer de huesos. Este medicamento puede ser utilizado para otros usos; si tiene alguna pregunta consulte con su proveedor de  atencin mdica o con su farmacutico. Qu le debo informar a mi profesional de la salud antes de tomar este medicamento? Necesita saber si usted presenta alguno de los siguientes problemas o situaciones: -enfermedad dental -eccema  -infeccin o antecedentes de infecciones  -enfermedad renal o si est bajo tratamiento de dilisis -bajo nivel de calcio o vitamina D en la sangre -sndrome de malabsorcin -una  ciruga programada o extracciones dentales -si toma medicamentos que contienen denosumab -enfermedad tiroidea o de las paratiroides -una reaccin alrgica o inusual al denosumab, a otros medicamentos, alimentos, colorantes o conservantes -si est embarazada o buscando quedar embarazada -si est amamantando a un beb Cmo debo utilizar este medicamento? Este medicamento se administra mediante inyeccin por va subcutnea. Lo administra un profesional de Radiographer, therapeutic en un hospital o en un entorno clnico. Si recibe Prolia, su farmacutico le dar una [place] del medicamento especial con cada receta y relleno. Asegrese de leer esta informacin cada vez cuidadosamente. Hable con su pediatra para informarse acerca del uso de este medicamento en nios. Puede requerir atencin especial. Sobredosis: Pngase en contacto inmediatamente con un centro toxicolgico o una sala de urgencia si usted cree que haya tomado demasiado medicamento. ATENCIN: [employer] es solo para usted. No comparta este medicamento con nadie. Qu sucede si me olvido de una dosis? Es importante no olvidar ninguna dosis. Informe a su mdico o a su profesional de la salud si no puede asistir a Marketing executive. Qu puede interactuar con este medicamento? No tome esta medicina con ninguno de los siguientes medicamentos: -otros medicamentos que contienen denosumab  Esta medicina tambin puede Product/process development scientist con los siguientes medicamentos: -medicamentos que suprimen el sistema inmunolgico -medicamentos para tratar Management consultant -medicamentos esteroideos, como la prednisona o la cortisona Puede ser que esta lista no menciona todas las posibles interacciones. Informe a su profesional de [employer] de [employer] productos a base de hierbas, medicamentos de [place] o suplementos nutritivos que est tomando. Si usted fuma, consume bebidas alcohlicas o si utiliza drogas ilegales, indqueselo tambin a su profesional de [employer]. Algunas sustancias  pueden interactuar con su medicamento. A qu debo estar atento al usar [employer]? Visite a su mdico o a su profesional de la salud para chequeos peridicos. Su mdico o su profesional de la salud puede pedirle anlisis de [place] u otras pruebas para Chief Operating Officer su evolucin. Si desarrolla un resfro u otra infeccin mientras est tomando [employer], consulte a su mdico o su profesional de [employer]. No se trate usted mismo. Este medicamento puede reducir la capacidad del cuerpo para combatir infecciones. Debe asegurarse de que su dieta incluya la cantidad necesaria de calcio y vitamina D mientras est tomando este medicamento, a menos que su mdico indique lo contrario. Hable con su profesional de la salud acerca de sus alimentos y las vitaminas que est tomando. Visite a su dentista habitualmente. [place] sus dientes o use hilo dental para los dientes como indicado. Antes de someterse a Engineer, production, informe a su dentista que est [employer]. Qu efectos secundarios puedo tener al [employer] este medicamento? Efectos secundarios que debe informar a su mdico o a Producer, television/film/video de la salud tan pronto como sea posible: -reacciones alrgicas como erupcin cutnea, picazn o urticarias, hinchazn de la cara, labios o lengua -problemas respiratorios -dolor en el pecho -pulso cardiaco rpido, irregular -sensacin de desmayos o mareos, cadas -fiebre, escalofros o cualquier otro signo de infeccin -espasmos o rigidez de los msculos  -hormigueo o entumecimiento -  formacin de bultos o ampollas, o piel seca, roja o descamacin de la piel -cicatrizacin lenta o dolor de mandbula o boca sin explicacin -sangrado o magulladuras inusuales  Efectos secundarios que, por lo general, no requieren atencin mdica (debe informarlos a su mdico o a su profesional de la salud si persisten o si son molestos): -dolor muscular -Dentist o gases estomacales Puede ser que esta  lista no menciona todos los posibles efectos secundarios. Comunquese a su mdico por asesoramiento mdico [employer]. Usted puede informar los efectos secundarios a la FDA por telfono al 1-800-FDA-1088. Dnde debo guardar mi medicina? Este medicamento se administra en clnicas, consultorio del mdico u otro establecimiento de atencin mdica y no necesitar guardarlo en su domicilio. ATENCIN: Este folleto es un resumen. Puede ser que no cubra toda la posible informacin. Si usted tiene preguntas acerca de esta medicina, consulte con su mdico, su farmacutico o su profesional de Radiographer, therapeutic.  2013, Elsevier/Gold Standard. (06/16/2010 4:41:37 PM)

## 2012-06-18 LAB — URINALYSIS W MICROSCOPIC + REFLEX CULTURE
Bilirubin Urine: NEGATIVE
Hgb urine dipstick: NEGATIVE
Leukocytes, UA: NEGATIVE
Protein, ur: NEGATIVE mg/dL
Urobilinogen, UA: 0.2 mg/dL (ref 0.0–1.0)

## 2012-06-24 ENCOUNTER — Encounter: Payer: Self-pay | Admitting: *Deleted

## 2012-06-24 ENCOUNTER — Other Ambulatory Visit: Payer: Self-pay | Admitting: *Deleted

## 2012-06-24 DIAGNOSIS — E7800 Pure hypercholesterolemia, unspecified: Secondary | ICD-10-CM

## 2012-06-24 NOTE — Progress Notes (Signed)
Patient ID: Ashley Estrada, female   DOB: [birth year], 67 y.o.   MRN: [id number] Pt due for Prolia per JF , insurance covers it $25, authorized through insurance til 10/08/12. Pt called by Elane Fritz. Apt scheduled for 12/30 for injection. KW

## 2012-06-27 ENCOUNTER — Ambulatory Visit (INDEPENDENT_AMBULATORY_CARE_PROVIDER_SITE_OTHER): Payer: BC Managed Care – PPO | Admitting: Anesthesiology

## 2012-06-27 DIAGNOSIS — M810 Age-related osteoporosis without current pathological fracture: Secondary | ICD-10-CM

## 2012-06-27 MED ORDER — DENOSUMAB 60 MG/ML ~~LOC~~ SOLN
60.0000 mg | Freq: Once | SUBCUTANEOUS | Status: AC
  Administered 2012-06-27: 60 mg via SUBCUTANEOUS

## 2012-07-08 ENCOUNTER — Encounter: Payer: Self-pay | Admitting: Gynecology

## 2012-08-02 ENCOUNTER — Ambulatory Visit (INDEPENDENT_AMBULATORY_CARE_PROVIDER_SITE_OTHER): Payer: PRIVATE HEALTH INSURANCE

## 2012-08-02 DIAGNOSIS — M810 Age-related osteoporosis without current pathological fracture: Secondary | ICD-10-CM

## 2012-08-03 ENCOUNTER — Other Ambulatory Visit: Payer: Self-pay | Admitting: Anesthesiology

## 2012-08-03 ENCOUNTER — Other Ambulatory Visit: Payer: Self-pay | Admitting: Gynecology

## 2012-08-03 DIAGNOSIS — Z1211 Encounter for screening for malignant neoplasm of colon: Secondary | ICD-10-CM

## 2012-08-31 ENCOUNTER — Emergency Department (HOSPITAL_COMMUNITY)
Admission: EM | Admit: 2012-08-31 | Discharge: 2012-08-31 | Disposition: A | Discharge status: Home / Self Care | Payer: PRIVATE HEALTH INSURANCE | Attending: Emergency Medicine | Admitting: Emergency Medicine

## 2012-08-31 ENCOUNTER — Encounter (HOSPITAL_COMMUNITY): Payer: Self-pay | Admitting: *Deleted

## 2012-08-31 ENCOUNTER — Emergency Department (HOSPITAL_COMMUNITY): Payer: PRIVATE HEALTH INSURANCE

## 2012-08-31 DIAGNOSIS — R0789 Other chest pain: Secondary | ICD-10-CM | POA: Insufficient documentation

## 2012-08-31 DIAGNOSIS — Z862 Personal history of diseases of the blood and blood-forming organs and certain disorders involving the immune mechanism: Secondary | ICD-10-CM | POA: Insufficient documentation

## 2012-08-31 DIAGNOSIS — Z87891 Personal history of nicotine dependence: Secondary | ICD-10-CM | POA: Insufficient documentation

## 2012-08-31 DIAGNOSIS — Z8639 Personal history of other endocrine, nutritional and metabolic disease: Secondary | ICD-10-CM | POA: Insufficient documentation

## 2012-08-31 DIAGNOSIS — M810 Age-related osteoporosis without current pathological fracture: Secondary | ICD-10-CM | POA: Insufficient documentation

## 2012-08-31 DIAGNOSIS — R079 Chest pain, unspecified: Secondary | ICD-10-CM

## 2012-08-31 DIAGNOSIS — E785 Hyperlipidemia, unspecified: Secondary | ICD-10-CM | POA: Insufficient documentation

## 2012-08-31 DIAGNOSIS — R42 Dizziness and giddiness: Secondary | ICD-10-CM | POA: Insufficient documentation

## 2012-08-31 DIAGNOSIS — Z79899 Other long term (current) drug therapy: Secondary | ICD-10-CM | POA: Insufficient documentation

## 2012-08-31 LAB — CBC WITH DIFFERENTIAL/PLATELET
HCT: 38.5 % (ref 36.0–46.0)
Hemoglobin: 13.1 g/dL (ref 12.0–15.0)
Lymphocytes Relative: 34 % (ref 12–46)
Lymphs Abs: 2.3 10*3/uL (ref 0.7–4.0)
MCHC: 34 g/dL (ref 30.0–36.0)
Monocytes Absolute: 0.7 10*3/uL (ref 0.1–1.0)
Monocytes Relative: 10 % (ref 3–12)
Neutro Abs: 3.4 10*3/uL (ref 1.7–7.7)
RBC: 4.35 MIL/uL (ref 3.87–5.11)
WBC: 6.7 10*3/uL (ref 4.0–10.5)

## 2012-08-31 LAB — BASIC METABOLIC PANEL
BUN: 13 mg/dL (ref 6–23)
Chloride: 101 mEq/L (ref 96–112)
GFR calc Af Amer: >90 mL/min (ref >90)
Potassium: 4 mEq/L (ref 3.5–5.1)
Sodium: 139 mEq/L (ref 135–145)

## 2012-08-31 LAB — POCT I-STAT TROPONIN I

## 2012-08-31 MED ORDER — OXYCODONE-ACETAMINOPHEN 5-325 MG PO TABS
2.0000 | ORAL_TABLET | Freq: Once | ORAL | Status: AC
  Administered 2012-08-31: 2 via ORAL
  Filled 2012-08-31: qty 2

## 2012-08-31 NOTE — ED Notes (Addendum)
C/o intermittent left side CP radiates into LUE x 3 weeks. Increased stress started same time. Pt anxious & teary eyed during assessment. +nausea, no emesis. Denies SOB. States CP can start while at rest. Denies fever, cold, cough. Pain increases with mvmt, deep breaths & palpation

## 2012-08-31 NOTE — ED Notes (Signed)
C/o intermittent SSCP x 1 week. States has been under a tremendous amount of stress lately. Pt was at work, took ASA 325mg . Per EMS - given NTG x2 with no improvement.

## 2012-08-31 NOTE — ED Provider Notes (Signed)
History     CSN: [id number]  Arrival date & time 08/31/12  1428   First MD Initiated Contact with Patient 08/31/12 1504      Chief Complaint  Patient presents with  . Chest Pain     Patient is a 67 y.o. female presenting with chest pain. The history is provided by the patient and a relative.  Chest Pain Pain location:  L chest Pain quality: aching and pressure   Pain radiates to:  L shoulder Pain radiates to the back: no   Pain severity:  Moderate Onset quality:  Gradual Timing:  Intermittent Progression:  Worsening Chronicity:  New Context: not breathing   Context comment:  Palpation Relieved by:  Rest Worsened by:  Certain positions (palpation) Associated symptoms: dizziness   Associated symptoms: no diaphoresis, no near-syncope, no shortness of breath, no syncope, not vomiting and no weakness   Risk factors: high cholesterol   pt with h/o CP for past several weeks, seems worse with palpation and movement of her left shoulder No falls No exertional pain No pleuritic pain She reports mild intermittent dizziness but none now Past Medical History  Diagnosis Date  . NSVD (normal spontaneous vaginal delivery)     x3  . Menopause 1997  . Osteoporosis   . Hyperlipidemia   . Seasonal allergies     Past Surgical History  Procedure Laterality Date  . Mass excision      RIGHT ARM - BENIGN    Family History  Problem Relation Age of Onset  . Osteoporosis Mother   . Diabetes Father     History  Substance Use Topics  . Smoking status: Former Smoker    Quit date: 06/16/1987  . Smokeless tobacco: Never Used  . Alcohol Use: No    OB History   Grav Para Term Preterm Abortions TAB SAB Ect Mult Living   3 3 3       3       Review of Systems  Constitutional: Negative for diaphoresis.  Respiratory: Negative for shortness of breath.   Cardiovascular: Positive for chest pain. Negative for syncope and near-syncope.  Gastrointestinal: Negative for vomiting.   Neurological: Positive for dizziness. Negative for weakness.  All other systems reviewed and are negative.    Allergies  Review of patient's allergies indicates no known allergies.  Home Medications   Current Outpatient Rx  Name  Route  Sig  Dispense  Refill  . calcium carbonate (OS-CAL) 600 MG TABS   Oral   Take 600 mg by mouth 2 (two) times daily with a meal.           . cholecalciferol (VITAMIN D) 1000 UNITS tablet   Oral   Take 1,000 Units by mouth daily.           Marland Kitchen denosumab (PROLIA) 60 MG/ML SOLN injection   Subcutaneous   Inject 60 mg into the skin every 6 (six) months. Administer in upper arm, thigh, or abdomen          . fish oil-omega-3 fatty acids 1000 MG capsule   Oral   Take 2 g by mouth daily.           Marland Kitchen glucosamine-chondroitin 500-400 MG tablet   Oral   Take 1 tablet by mouth 3 (three) times daily.           . niacin 250 MG tablet   Oral   Take 250 mg by mouth daily with breakfast.         .  vitamin E 600 UNIT capsule   Oral   Take 600 Units by mouth daily.           BP 153/66  Pulse 74  Temp(Src) 97.8 F (36.6 C) (Oral)  SpO2 98%  Physical Exam CONSTITUTIONAL: Well developed/well nourished HEAD: Normocephalic/atraumatic EYES: EOMI/PERRL ENMT: Mucous membranes moist NECK: supple no meningeal signs SPINE:entire spine nontender CV: S1/S2 noted, no murmurs/rubs/gallops noted Chest - tender to palpation, no crepitance LUNGS: Lungs are clear to auscultation bilaterally, no apparent distress ABDOMEN: soft, nontender, no rebound or guarding GU:no cva tenderness NEURO: Pt is awake/alert, moves all extremitiesx4 EXTREMITIES: pulses normal, full ROM, no edema or calf tenderness SKIN: warm, color normal PSYCH: no abnormalities of mood noted  ED Course  Procedures   Labs Reviewed  BASIC METABOLIC PANEL  CBC WITH DIFFERENTIAL   Pt well appearing.  No EKG or changes in troponin.  Her CP is always reproducible and has minimal  associated symptoms.  I doubt ACS/PE/Dissection at this time.  I feel she is safe for d/c home and cardiology followup.  Patient/family agreeable.  Pt is able to speak and understand english  MDM  Nursing notes including past medical history and social history reviewed and considered in documentation xrays reviewed and considered Labs/vital reviewed and considered        Date: 08/31/2012 14:34  Rate: 79  Rhythm: normal sinus rhythm  QRS Axis: normal  Intervals: normal  ST/T Wave abnormalities: normal  Conduction Disutrbances:none  Narrative Interpretation:   Old EKG Reviewed: none available at time of interpretation   Date: 08/31/2012 1745  Rate: 59  Rhythm: normal sinus rhythm  QRS Axis: normal  Intervals: normal  ST/T Wave abnormalities: normal  Conduction Disutrbances:none  Narrative Interpretation:   Old EKG Reviewed: unchanged    Joya Gaskins, MD 08/31/12 1818

## 2013-02-22 ENCOUNTER — Ambulatory Visit (INDEPENDENT_AMBULATORY_CARE_PROVIDER_SITE_OTHER): Payer: PRIVATE HEALTH INSURANCE | Admitting: Anesthesiology

## 2013-02-22 DIAGNOSIS — M810 Age-related osteoporosis without current pathological fracture: Secondary | ICD-10-CM

## 2013-02-22 MED ORDER — DENOSUMAB 60 MG/ML ~~LOC~~ SOLN
60.0000 mg | Freq: Once | SUBCUTANEOUS | Status: AC
  Administered 2013-02-22: 60 mg via SUBCUTANEOUS

## 2013-05-22 ENCOUNTER — Other Ambulatory Visit: Payer: Self-pay | Admitting: Gynecology

## 2013-05-22 DIAGNOSIS — Z1231 Encounter for screening mammogram for malignant neoplasm of breast: Secondary | ICD-10-CM

## 2013-06-06 ENCOUNTER — Ambulatory Visit (HOSPITAL_COMMUNITY)
Admission: RE | Admit: 2013-06-06 | Discharge: 2013-06-06 | Disposition: A | Discharge status: Home / Self Care | Payer: Medicare Other | Source: Ambulatory Visit | Attending: Gynecology | Admitting: Gynecology

## 2013-06-06 DIAGNOSIS — Z1231 Encounter for screening mammogram for malignant neoplasm of breast: Secondary | ICD-10-CM | POA: Insufficient documentation

## 2013-06-19 ENCOUNTER — Encounter: Payer: Self-pay | Admitting: Gynecology

## 2013-06-19 ENCOUNTER — Ambulatory Visit (INDEPENDENT_AMBULATORY_CARE_PROVIDER_SITE_OTHER): Payer: Medicare Other | Admitting: Gynecology

## 2013-06-19 VITALS — BP 136/82 | Ht 59.5 in | Wt 141.0 lb

## 2013-06-19 DIAGNOSIS — M129 Arthropathy, unspecified: Secondary | ICD-10-CM

## 2013-06-19 DIAGNOSIS — Z01419 Encounter for gynecological examination (general) (routine) without abnormal findings: Secondary | ICD-10-CM

## 2013-06-19 DIAGNOSIS — M1990 Unspecified osteoarthritis, unspecified site: Secondary | ICD-10-CM

## 2013-06-19 DIAGNOSIS — R635 Abnormal weight gain: Secondary | ICD-10-CM

## 2013-06-19 DIAGNOSIS — E785 Hyperlipidemia, unspecified: Secondary | ICD-10-CM

## 2013-06-19 DIAGNOSIS — K649 Unspecified hemorrhoids: Secondary | ICD-10-CM

## 2013-06-19 DIAGNOSIS — M810 Age-related osteoporosis without current pathological fracture: Secondary | ICD-10-CM

## 2013-06-19 LAB — URINALYSIS W MICROSCOPIC + REFLEX CULTURE
Casts: NONE SEEN
Crystals: NONE SEEN
Ketones, ur: NEGATIVE mg/dL
Leukocytes, UA: NEGATIVE
Nitrite: NEGATIVE
Specific Gravity, Urine: 1.017 (ref 1.005–1.030)
Urobilinogen, UA: 0.2 mg/dL (ref 0.0–1.0)
pH: 7 (ref 5.0–8.0)

## 2013-06-19 LAB — CBC WITH DIFFERENTIAL/PLATELET
Basophils Absolute: 0.1 10*3/uL (ref 0.0–0.1)
HCT: 39 % (ref 36.0–46.0)
Hemoglobin: 13.1 g/dL (ref 12.0–15.0)
Lymphocytes Relative: 42 % (ref 12–46)
Monocytes Absolute: 0.4 10*3/uL (ref 0.1–1.0)
Monocytes Relative: 7 % (ref 3–12)
Neutro Abs: 2.6 10*3/uL (ref 1.7–7.7)
RDW: 14.4 % (ref 11.5–15.5)
WBC: 6.1 10*3/uL (ref 4.0–10.5)

## 2013-06-19 LAB — COMPREHENSIVE METABOLIC PANEL
ALT: 20 U/L (ref 0–35)
BUN: 15 mg/dL (ref 6–23)
CO2: 22 mEq/L (ref 19–32)
Creat: 0.61 mg/dL (ref 0.50–1.10)
Total Bilirubin: 0.3 mg/dL (ref 0.3–1.2)

## 2013-06-19 LAB — LIPID PANEL
Cholesterol: 258 mg/dL — ABNORMAL HIGH (ref 0–200)
HDL: 49 mg/dL (ref >39)
LDL Cholesterol: 164 mg/dL — ABNORMAL HIGH (ref 0–99)
Triglycerides: 225 mg/dL — ABNORMAL HIGH (ref <150)

## 2013-06-19 LAB — TSH: TSH: 3.141 u[IU]/mL (ref 0.350–4.500)

## 2013-06-19 NOTE — Patient Instructions (Signed)
Osteoartritis  (Osteoarthritis)  La osteoartritis es la más frecuente de las enfermedades artríticas. Es la inflamación, dolor e hinchazón en un cartílago. El cartílago actúa como una almohadilla que cubre los extremos de los huesos que forman una articulación.  CAUSAS  Con el paso del tiempo, el cartílago se gasta. Esto hace que los huesos se froten entre sí. Entonces aparece el dolor y la rigidez en la articulación afectada. Los factores que contribuyen a este problema son:  · Exceso de peso corporal.  · La edad.  · Uso excesivo de la articulación.  SÍNTOMAS  · Las personas que sufren osteoartritis experimentan dolor, hinchazón y rigidez en las articulaciones.  · Con el tiempo, la articulación pierde su forma normal.  · Pequeños depósitos de hueso (osteofitos) pueden desarrollarse en los extremos de la articulación.  · Algunos trozos de hueso o cartílago pueden separarse y flotar dentro del espacio de la articulación. Esto puede causar más dolor y lesiones.  · La osteoartritis también causa depresión, ansiedad, sensación de impotencia y limitaciones en las actividades diarias.  Las articulaciones más afectadas son:  · Extremos de los dedos.  · Pulgares  · El cuello  · La zona inferior de la espalda.  · Las rodillas.  · Las caderas  DIAGNÓSTICO  El diagnóstico se realiza basándose en los síntomas y el examen físico. Hay estudios que pueden ser de ayuda en el diagnóstico tales como:  · Radiografías de la articulación afectada.  · Resonancia magnética (RMN).  · Análisis de sangre para descartar otros tipos de artritis.  · Análisis de los fluidos de la articulación. Para ello se utiliza una aguja y se extrae líquido de la articulación afectada y se examina con el microscopio.  TRATAMIENTO  Los objetivos del tratamiento son controlar el dolor, mejorar la función de la articulación, mantener un peso corporal adecuado y mantener un estilo de vida saludable. Los tratamientos pueden ser:  · Un programa de actividad física  prescripto para reposo y alivio de la articulación.  · Control del peso con educación nutricional,  · Técnicas de alivio del dolor como:  · Aplicación correcta de frío y calor.  · Impulsos eléctricos enviados a las terminaciones nerviosas que se encuentran debajo de la piel (estimulación eléctrica nerviosa transcutánea).  · Masajes.  · Ciertos suplementos. Consulte con su médico antes de usar cualquier suplemento, especialmente en combinación con los medicamentos prescriptos.  · Medicamentos para controlar el dolor como:  · Acetaminofeno.  · Antiinflamatorios no esteroides (AINE), como el naproxeno.  · Narcóticos o agentes de acción central, como el tramadol. Estos medicamentos tienen el riesgo de adicción y generalmente se prescriben para ser utilizados por un período breve.  · Corticoides. Pueden administrarse por vía oral o inyectable. Este es un tratamiento a corto plazo, y no se recomienda para uso de rutina.  · Cirugía para reposicionar los huesos y aliviar el dolor (osteotomía) o para retirar las piezas sueltas de hueso y cartílago. Puede ser necesario el reemplazo de las articulaciones en estadios avanzados de la enfermedad.  INSTRUCCIONES PARA EL CUIDADO DOMICILIARIO  El médico puede recomendar tipos específicos de ejercicios. Aquí se incluyen:  · Ejercicios de fortalecimiento Se realizan para fortalecer los músculos que sostienen las articulaciones afectadas por la artritis. Pueden realizarse con peso o con bandas para agregar resistencia.  · Actividad aeróbica. Son ejercicios como caminar a paso ligero, gimnasia aeróbica de bajo impacto, que acelere el corazón. Ayudan a mantener los pulmones y el sistema circulatorio en forma.  ·   Actividades de amplitud de movimientos. Dan agilidad a las articulaciones.  · Ejercicios de equilibrio y agilidad. Ayudan a mantener las destrezas que se necesitan para la vida diaria.  Aprender acerca de su enfermedad y permanecer activamente involucrado en el tratamiento lo  ayudarán a mejorar el curso de la osteoartritis.  SOLICITE ATENCIÓN MÉDICA SI:  · Siente que le sube la temperatura o la piel se enrojece.  · Aparece una erupción además del dolor en la articulación.  · La temperatura oral se eleva sin motivo por arriba de 38,9° C (102° F).  PARA OBTENER MÁS INFORMACIÓN  National Institute of Arthritis and Musculoskeletal and Skin Diseases : (Instituto Nacional para la Artritis y las Enfermedades Musculoesqueléticas y Dermatológicas. www.niams.nih.gov  National Institute on Aging Instituto Nacional sobre el Envejecimiento: www.nia.nih.gov  American College of Rheumatology  (Instituto Norteamericano de Reumatología): www.rheumatology.org  Document Released: 03/25/2005 Document Revised: 09/07/2011  [employer]® Patient Information ©2014 [employer].

## 2013-06-19 NOTE — Progress Notes (Signed)
Ashley Estrada February 26, 1946 [id number]   History:    67 y.o.  4 GYN followup. Patient with history of osteoporosis.Review of her record indicated she was started on Prolia in 2010 but has not been getting her injections every 6 months as has previously been prescribed. Patient's last injection was August 2014. Bone density study this year when compared with 2011 demonstrated significant positive change in the right femoral neck of 4.4%. The lowest T score was at the AP spine with a value over -2.6. Patient with no prior history of abnormal Pap smear. Her colonoscopy was 10 years ago.  Dr. Lerry Liner has been her primary physician who has treated her for hypercholesterolemia for she hadbeen placed on Crestor an informed me that she ran out of it one month ago and has not had a refill. She was fasting today and wanted to have her blood work drawn here today as well. bnormal Pap smear. Her colonoscopy was 10 years ago. Patient states her mammogram was this month and was normal. She states that she doesn't monthly breast exam. She occasional suffer from hemorrhoids. She also started to complain of joint pains in her hands and she stopped working.  Past medical history,surgical history, family history and social history were all reviewed and documented in the EPIC chart.  Gynecologic History No LMP recorded. Patient is postmenopausal. Contraception: post menopausal status Last Pap: 2012. Results were: normal Last mammogram: 2014. Results were: normal  Obstetric History OB History  Gravida Para Term Preterm AB SAB TAB Ectopic Multiple Living  3 3 3       3     # Outcome Date GA Lbr Len/2nd Weight Sex Delivery Anes PTL Lv  3 TRM     F SVD  N Y  2 TRM     M SVD  N Y  1 TRM     M SVD  N Y       ROS: A ROS was performed and pertinent positives and negatives are included in the history.  GENERAL: No fevers or chills. HEENT: No change in vision, no earache, sore throat or sinus congestion.  NECK: No pain or stiffness. CARDIOVASCULAR: No chest pain or pressure. No palpitations. PULMONARY: No shortness of breath, cough or wheeze. GASTROINTESTINAL: No abdominal pain, nausea, vomiting or diarrhea, melena or bright red blood per rectum. GENITOURINARY: No urinary frequency, urgency, hesitancy or dysuria. MUSCULOSKELETAL: No joint or muscle pain, no back pain, no recent trauma. DERMATOLOGIC: No rash, no itching, no lesions. ENDOCRINE: No polyuria, polydipsia, no heat or cold intolerance. No recent change in weight. HEMATOLOGICAL: No anemia or easy bruising or bleeding. NEUROLOGIC: No headache, seizures, numbness, tingling or weakness. PSYCHIATRIC: No depression, no loss of interest in normal activity or change in sleep pattern.     Exam: chaperone present  BP 136/82  Ht 4' 11.5" (1.511 m)  Wt 141 lb (63.957 kg)  BMI 28.01 kg/m2  Body mass index is 28.01 kg/(m^2).  General appearance : Well developed well nourished female. No acute distress HEENT: Neck supple, trachea midline, no carotid bruits, no thyroidmegaly Lungs: Clear to auscultation, no rhonchi or wheezes, or rib retractions  Heart: Regular rate and rhythm, no murmurs or gallops Breast:Examined in sitting and supine position were symmetrical in appearance, no palpable masses or tenderness,  no skin retraction, no nipple inversion, no nipple discharge, no skin discoloration, no axillary or supraclavicular lymphadenopathy Abdomen: no palpable masses or tenderness, no rebound or guarding Extremities: no edema or skin  discoloration or tenderness  Pelvic:  Bartholin, Urethra, Skene Glands: Within normal limits             Vagina: No gross lesions or discharge  Cervix: No gross lesions or discharge  Uterus  axial normal size, shape and consistency, non-tender and mobile  Adnexa  Without masses or tenderness  Anus and perineum  normal   Rectovaginal  normal sphincter tone without palpated masses or tenderness             Hemoccult  card provided     Assessment/Plan:  67 y.o. female for annual exam with history of osteoporosis who has been on Prolia since 2010 has had good positive result. We discussed continuing for 2-3 more years than going on drug quality. She was reminded to continue to take her calcium and vitamin D as well as regular exercise. She was reminded to schedule her overdue colonoscopy. Hemoccult cards were provided for her to submit to the office for testing. She received the flu vaccine today. For occasional hemorrhoids she can use Preparation H when necessary. The following lab work today: Fasting lipid profile, comprehensive metabolic panel, CBC, urinalysis, TSH,.Vit D.  We will check a rheumatoid factor because of her hand joint pains. She may take Motrin over-the-counter when necessary. Because of her chronic sinus infections and rhinorrhea I recommended she see an ENT since she has gone to numerous allergies in the past with no success. She may have underlying nasal polyps. All the above was explained to the patient's bandage. Her next Prolia is due in February 2015.  Note: This dictation was prepared with  Dragon/digital dictation along withSmart phrase technology. Any transcriptional errors that result from this process are unintentional.   [name] MD, 9:01 AM 06/19/2013

## 2013-06-20 ENCOUNTER — Other Ambulatory Visit: Payer: Self-pay | Admitting: Gynecology

## 2013-06-20 ENCOUNTER — Telehealth: Payer: Self-pay

## 2013-06-20 DIAGNOSIS — E785 Hyperlipidemia, unspecified: Secondary | ICD-10-CM

## 2013-06-20 DIAGNOSIS — E7800 Pure hypercholesterolemia, unspecified: Secondary | ICD-10-CM

## 2013-06-20 MED ORDER — ROSUVASTATIN CALCIUM 20 MG PO TABS: 20.0000 mg | ORAL_TABLET | Freq: Every day | ORAL | Status: DC

## 2013-06-20 NOTE — Telephone Encounter (Signed)
Rx called in . Recall put in system.

## 2013-06-20 NOTE — Telephone Encounter (Signed)
Message copied by Keenan Bachelor on Tue Jun 20, 2013 12:18 PM ------      Message from: [name]      Created: Tue Jun 20, 2013  8:06 AM       Please inform patient that her lipid profile was abnormal. She ran out of her Crestor one month ago and has not been taking it. Please remind her to pick up her prescription. She needs to have a good compliance and take it everyday. We need to repeat her lipid profile in the fasting state in 6 months to see how she is responding once she gets back on her medication. ------

## 2013-06-20 NOTE — Telephone Encounter (Addendum)
Patient needs Rx for Crestor.  She said she stopped it because it was too expensive. She asked if you might prescribe a generic as Crestor brand only.  Patient said she now has Medicare and was hoping to only see one doctor (you).

## 2013-06-20 NOTE — Telephone Encounter (Signed)
Please tell patient in that Crestor is generic. Call in  prescription for Rosuvastatin (Generic brand for Crestor) tell her to take 20 mg daily. I would light to repeat her lipid profile in a fasting state and liver function tests in 6 months. Prescribe 30 tablets with 11 refills

## 2013-07-19 ENCOUNTER — Other Ambulatory Visit: Payer: Medicare Other | Admitting: Anesthesiology

## 2013-07-19 DIAGNOSIS — Z1211 Encounter for screening for malignant neoplasm of colon: Secondary | ICD-10-CM

## 2013-07-19 DIAGNOSIS — Z1282 Encounter for screening for malignant neoplasm of nervous system: Secondary | ICD-10-CM

## 2013-10-23 ENCOUNTER — Telehealth: Payer: Self-pay | Admitting: *Deleted

## 2013-10-23 NOTE — Telephone Encounter (Signed)
Pt was informed by [place] in [place] the benefits of Prolia.  She has Medicare therefore if the $147 deductible has been met then she has that to pay along with 20%. If deductible met then she is responsible for 20%. Pt agreed to pay. Pt will schedule apt to come for injection KW CMA

## 2013-10-30 ENCOUNTER — Ambulatory Visit (INDEPENDENT_AMBULATORY_CARE_PROVIDER_SITE_OTHER): Payer: Medicare Other | Admitting: Anesthesiology

## 2013-10-30 DIAGNOSIS — M810 Age-related osteoporosis without current pathological fracture: Secondary | ICD-10-CM

## 2013-10-30 MED ORDER — DENOSUMAB 60 MG/ML ~~LOC~~ SOLN
60.0000 mg | Freq: Once | SUBCUTANEOUS | Status: AC
  Administered 2013-10-30: 60 mg via SUBCUTANEOUS

## 2014-01-02 IMAGING — MG MM DIGITAL SCREENING BILAT W/ CAD
1 series · 4 of 4 positions shown · non-contrast
Comparison: none

COMMENTS:

PROCEDURE:     MAM - MAM DGT SCR W/CAD CORP NO ORDER  - April 19, 2012  [DATE]
RESULT:     The breasts are heterogeneously dense. Nodularity present.
Benign calcification. Exam stable from prior exams.

[R CC · right · 4 of 4 slices shown]
[im 1/4]
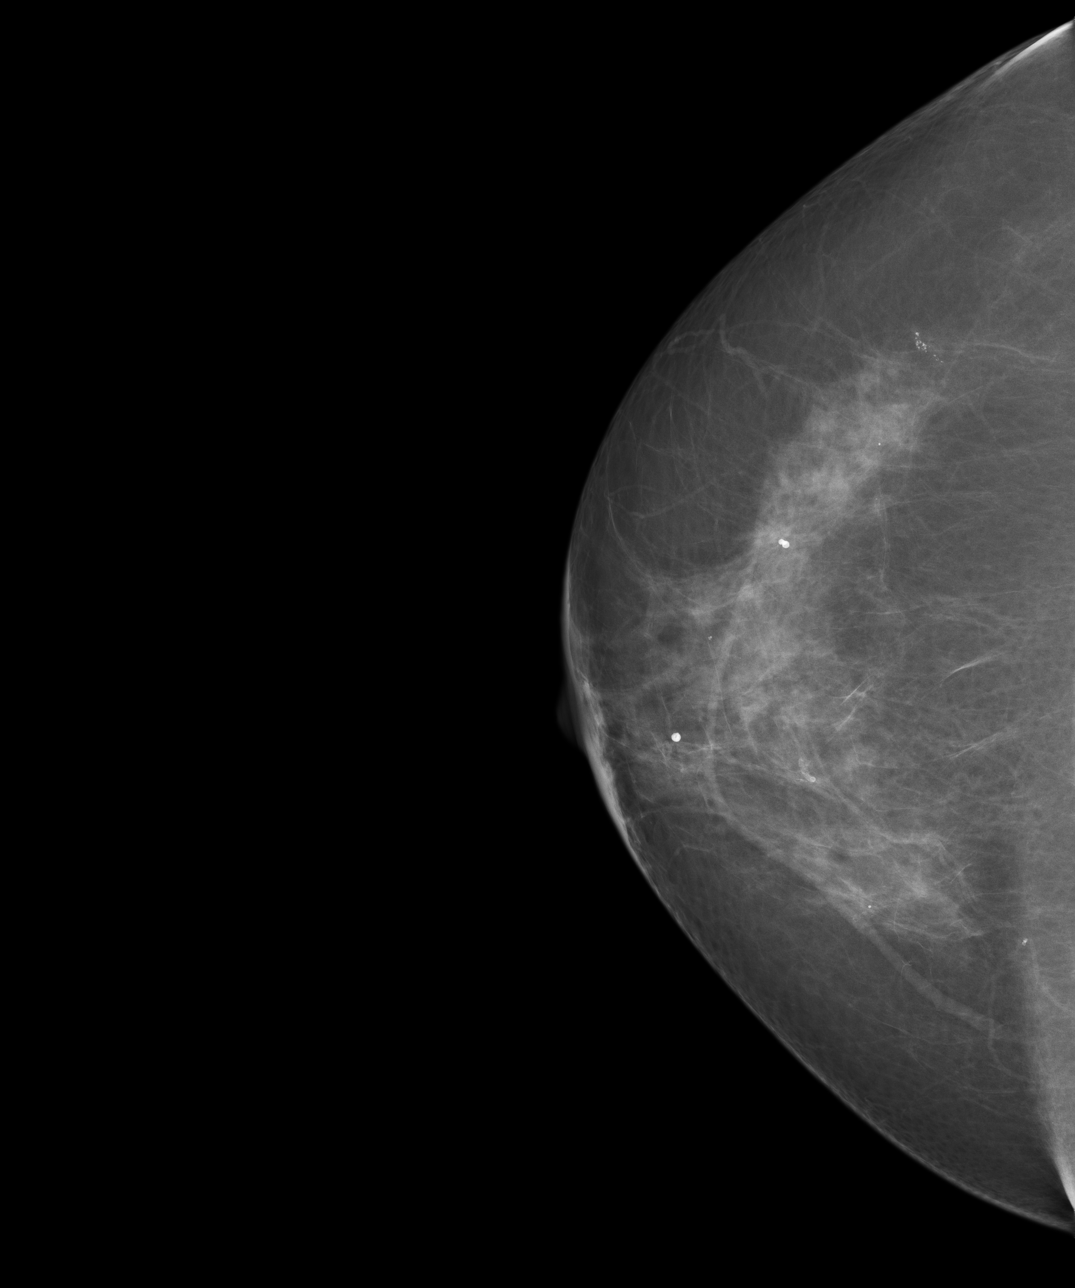
[im 2/4]
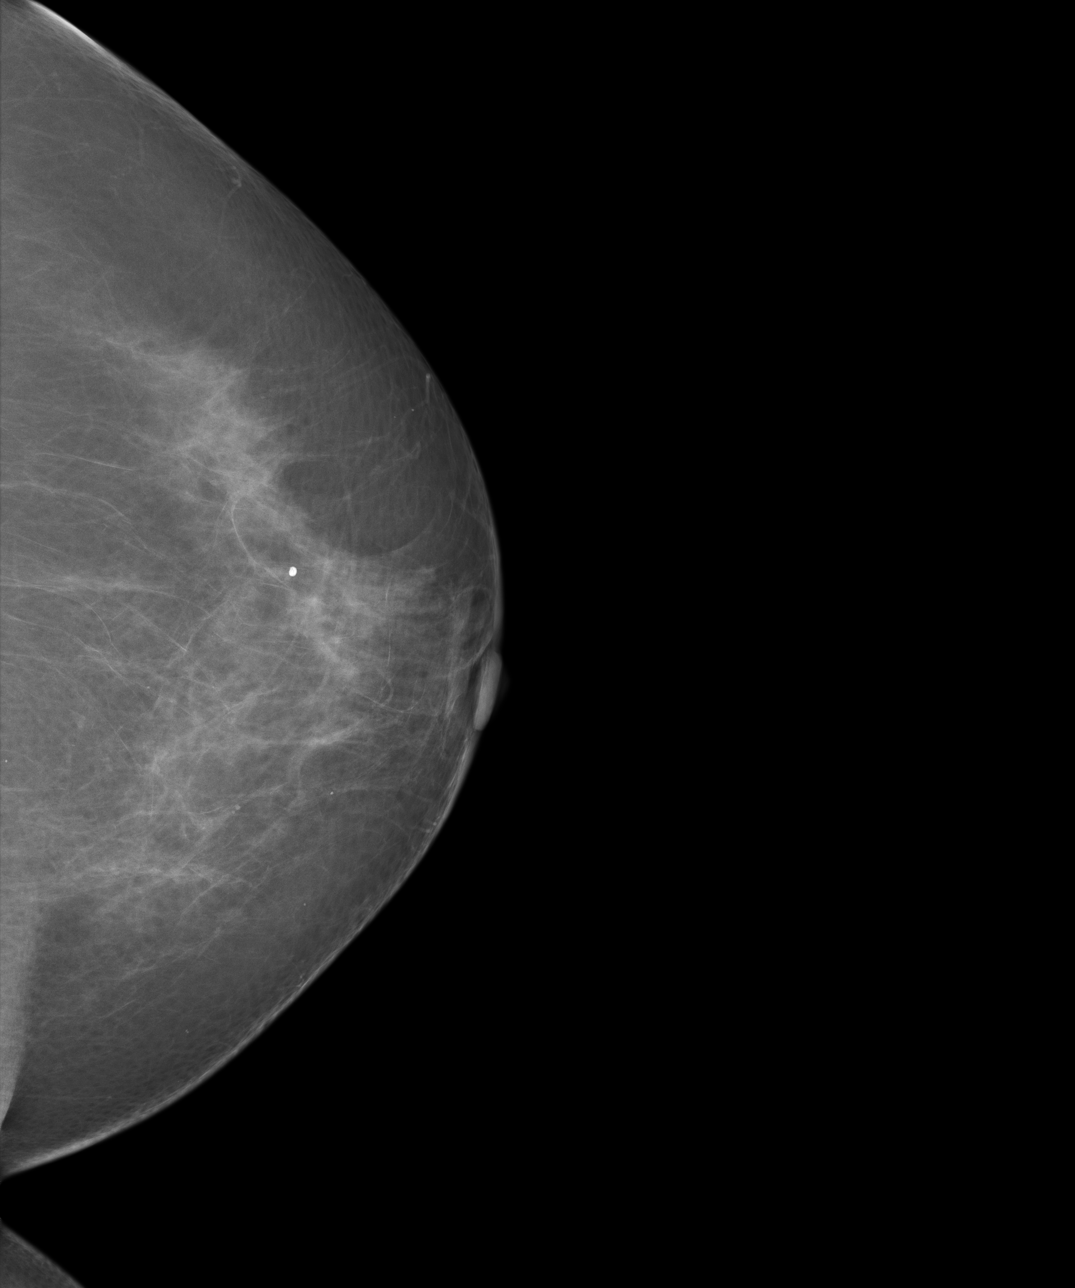
[im 3/4]
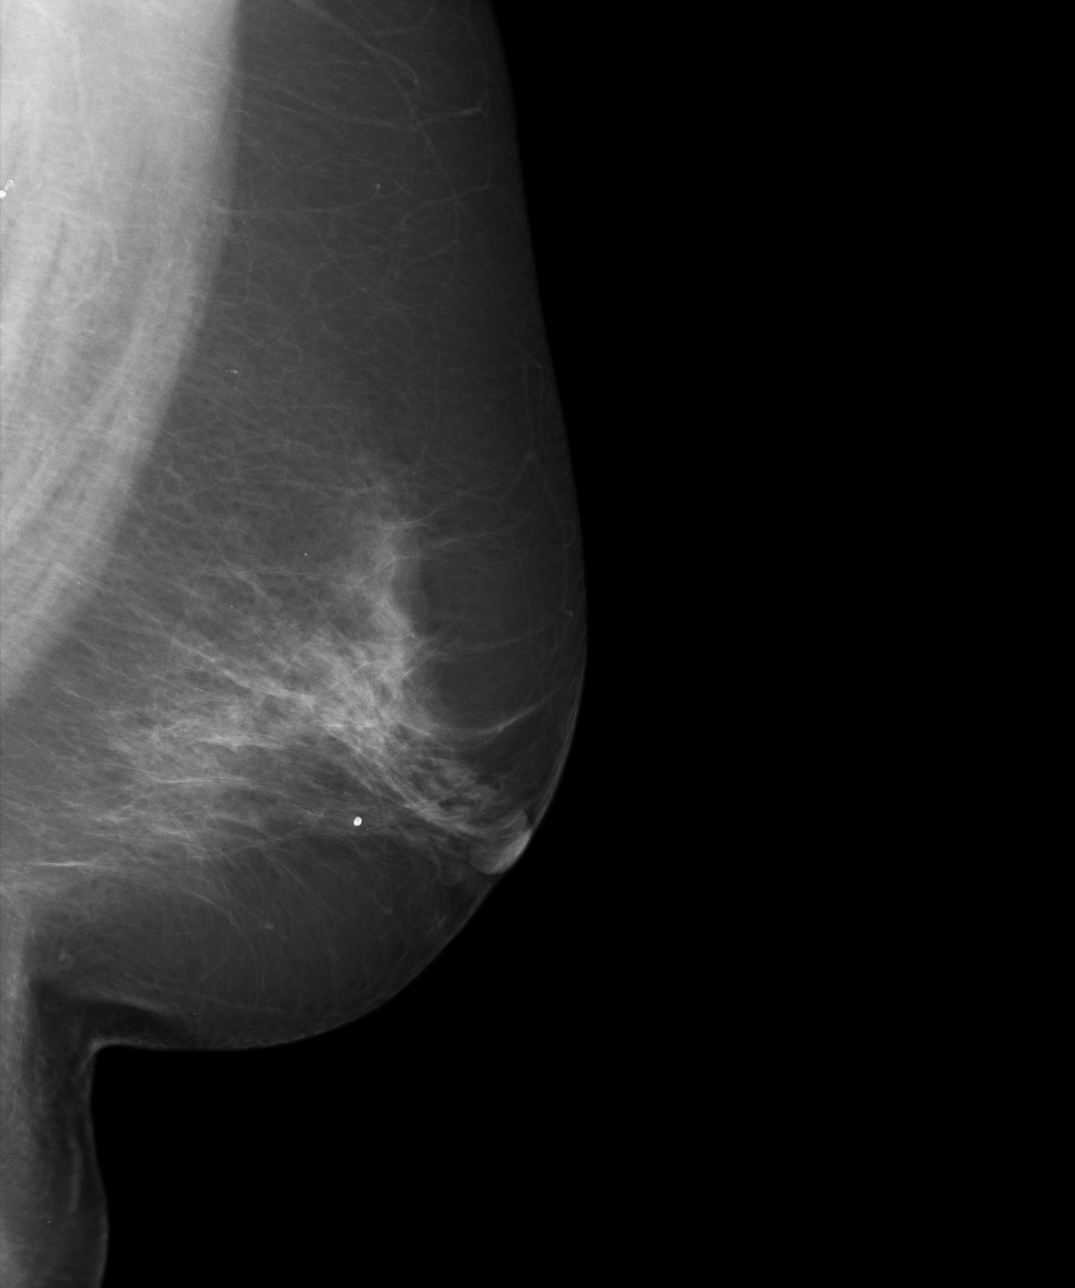
[im 4/4]
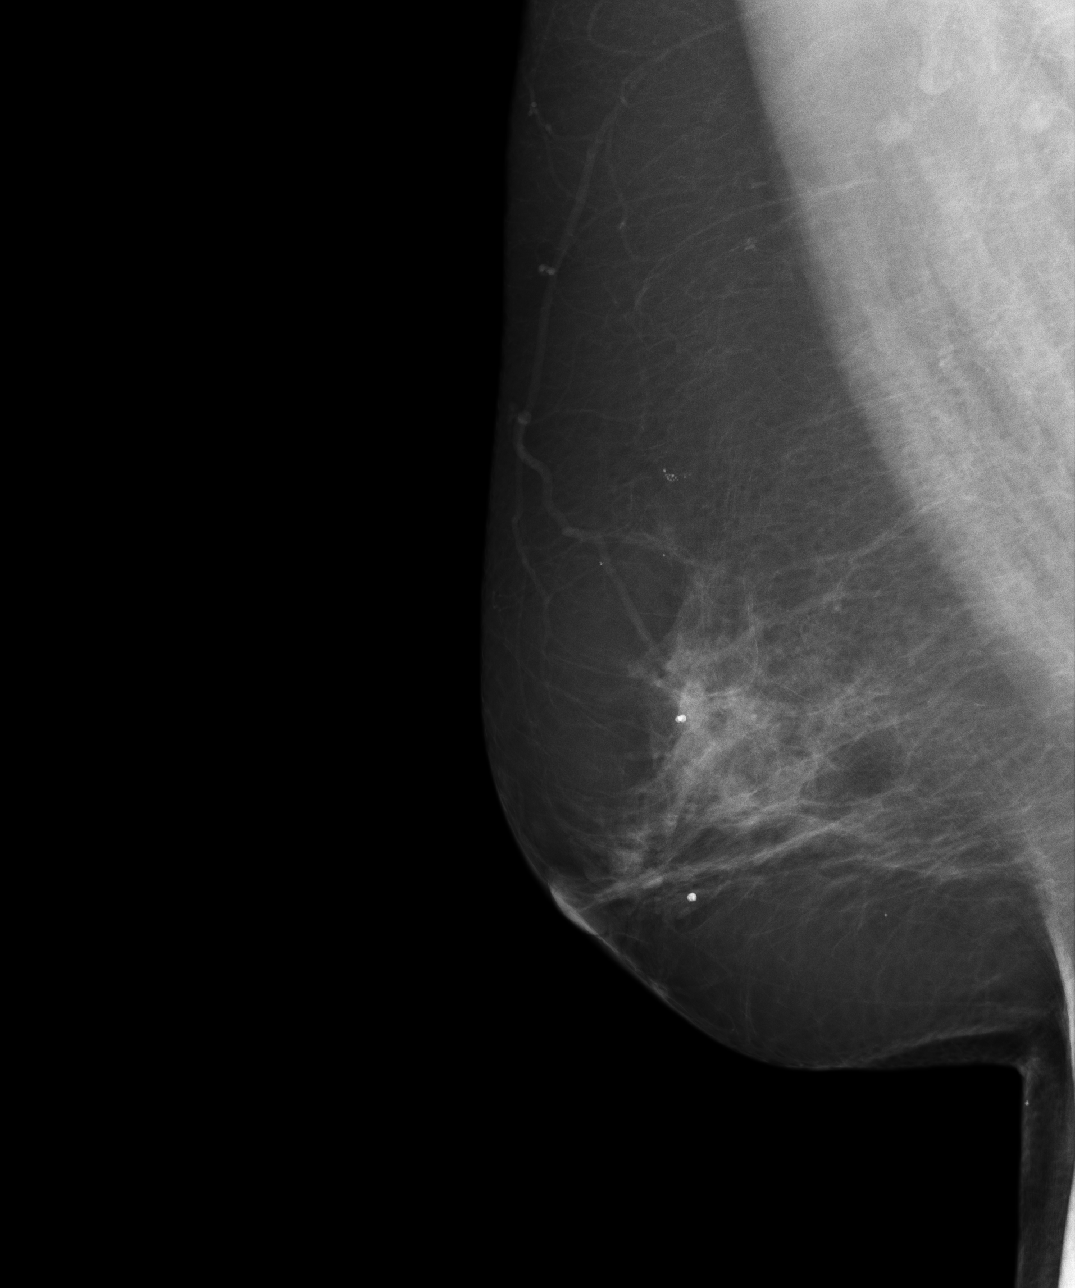

[4 of 4 positions shown; findings below may reference images not displayed]

IMPRESSION: Benign exam.

BI-RADS: Category 2- Benign Finding

A NEGATIVE MAMMOGRAM REPORT DOES NOT PRECLUDE BIOPSY OR OTHER EVALUATION OF
A CLINICALLY PALPABLE OR OTHERWISE SUSPICIOUS MASS OR LESION. BREAST CANCER
MAY NOT BE DETECTED IN UP TO 10% OF CASES.

## 2014-04-30 ENCOUNTER — Encounter: Payer: Self-pay | Admitting: Gynecology

## 2014-05-16 IMAGING — CR DG CHEST 2V
2 series · 2 of 2 positions shown · non-contrast
Comparison: None.

CLINICAL DATA: Chest pain, shortness of breath.

CHEST - 2 VIEW

[w chest pa]
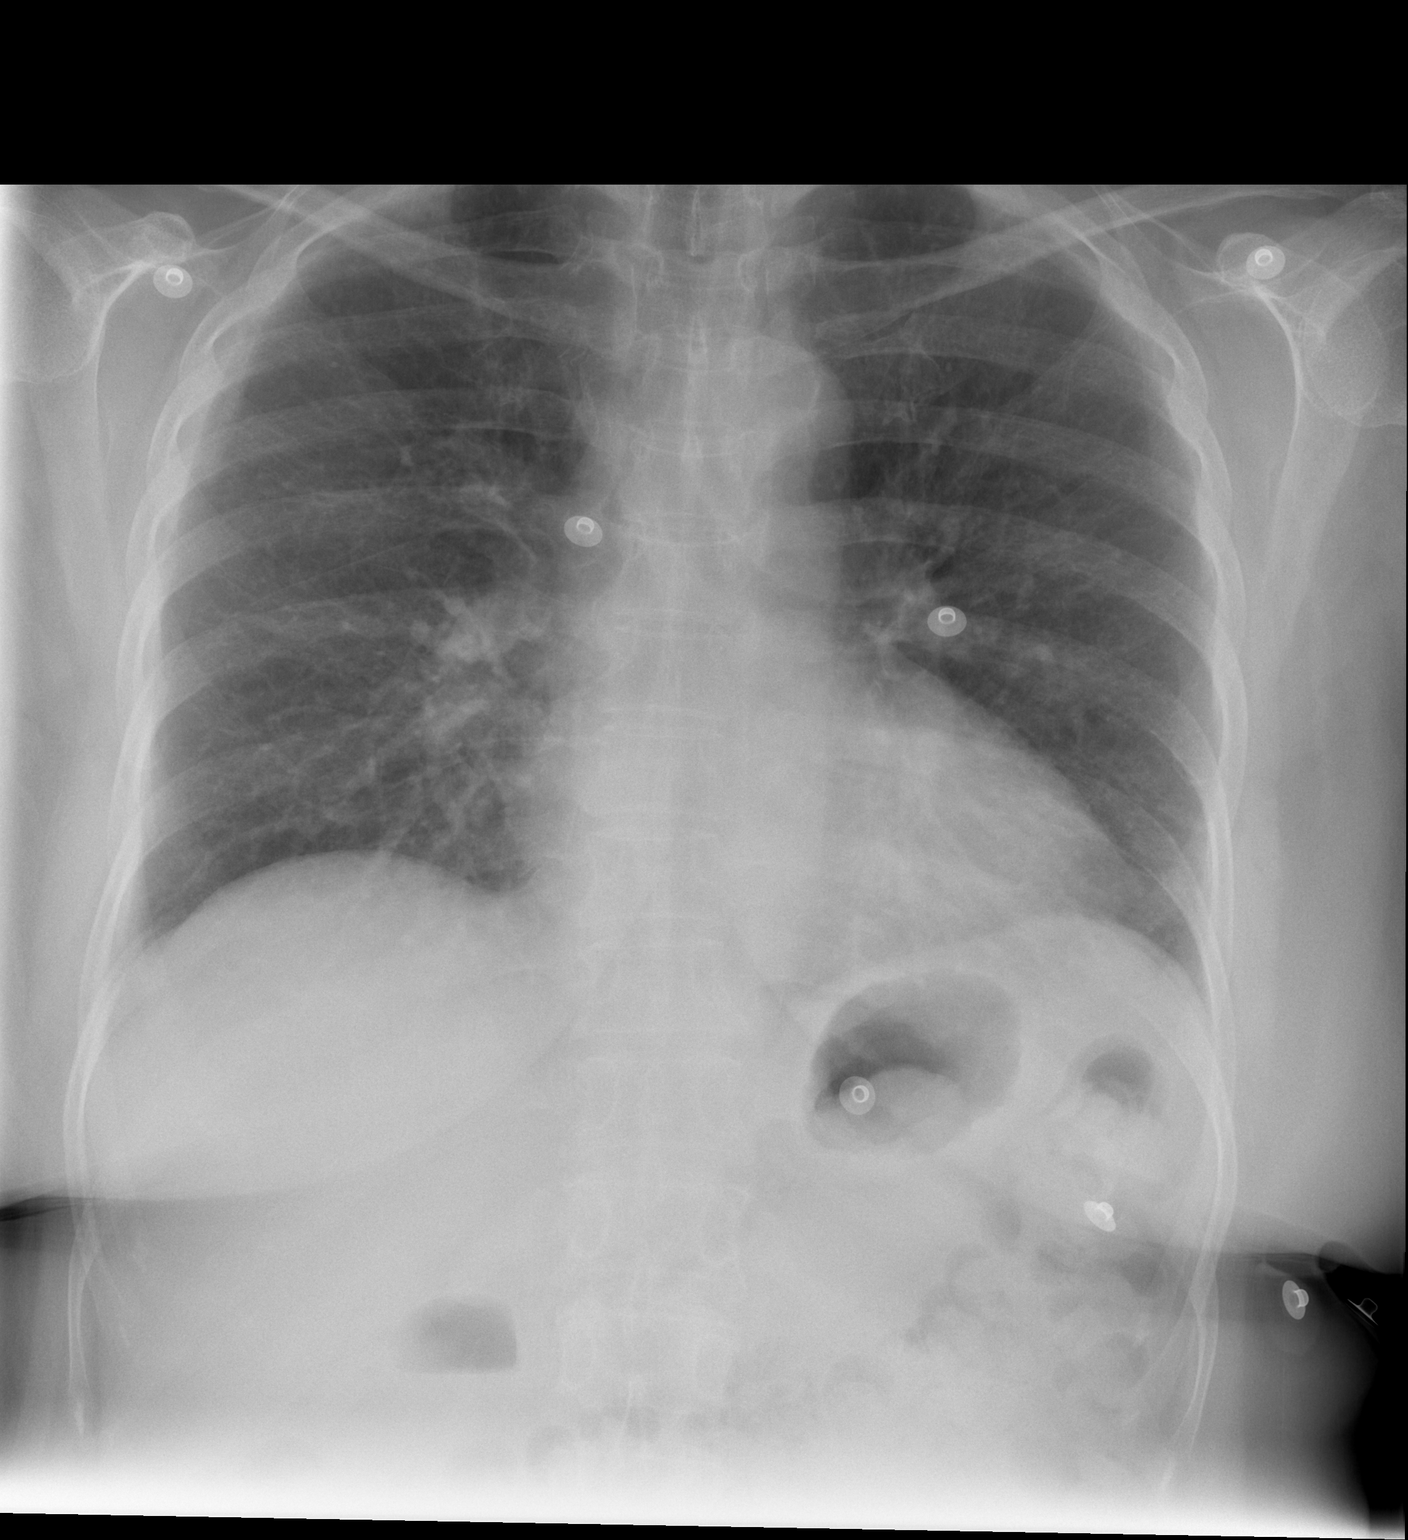

[w chest lat]
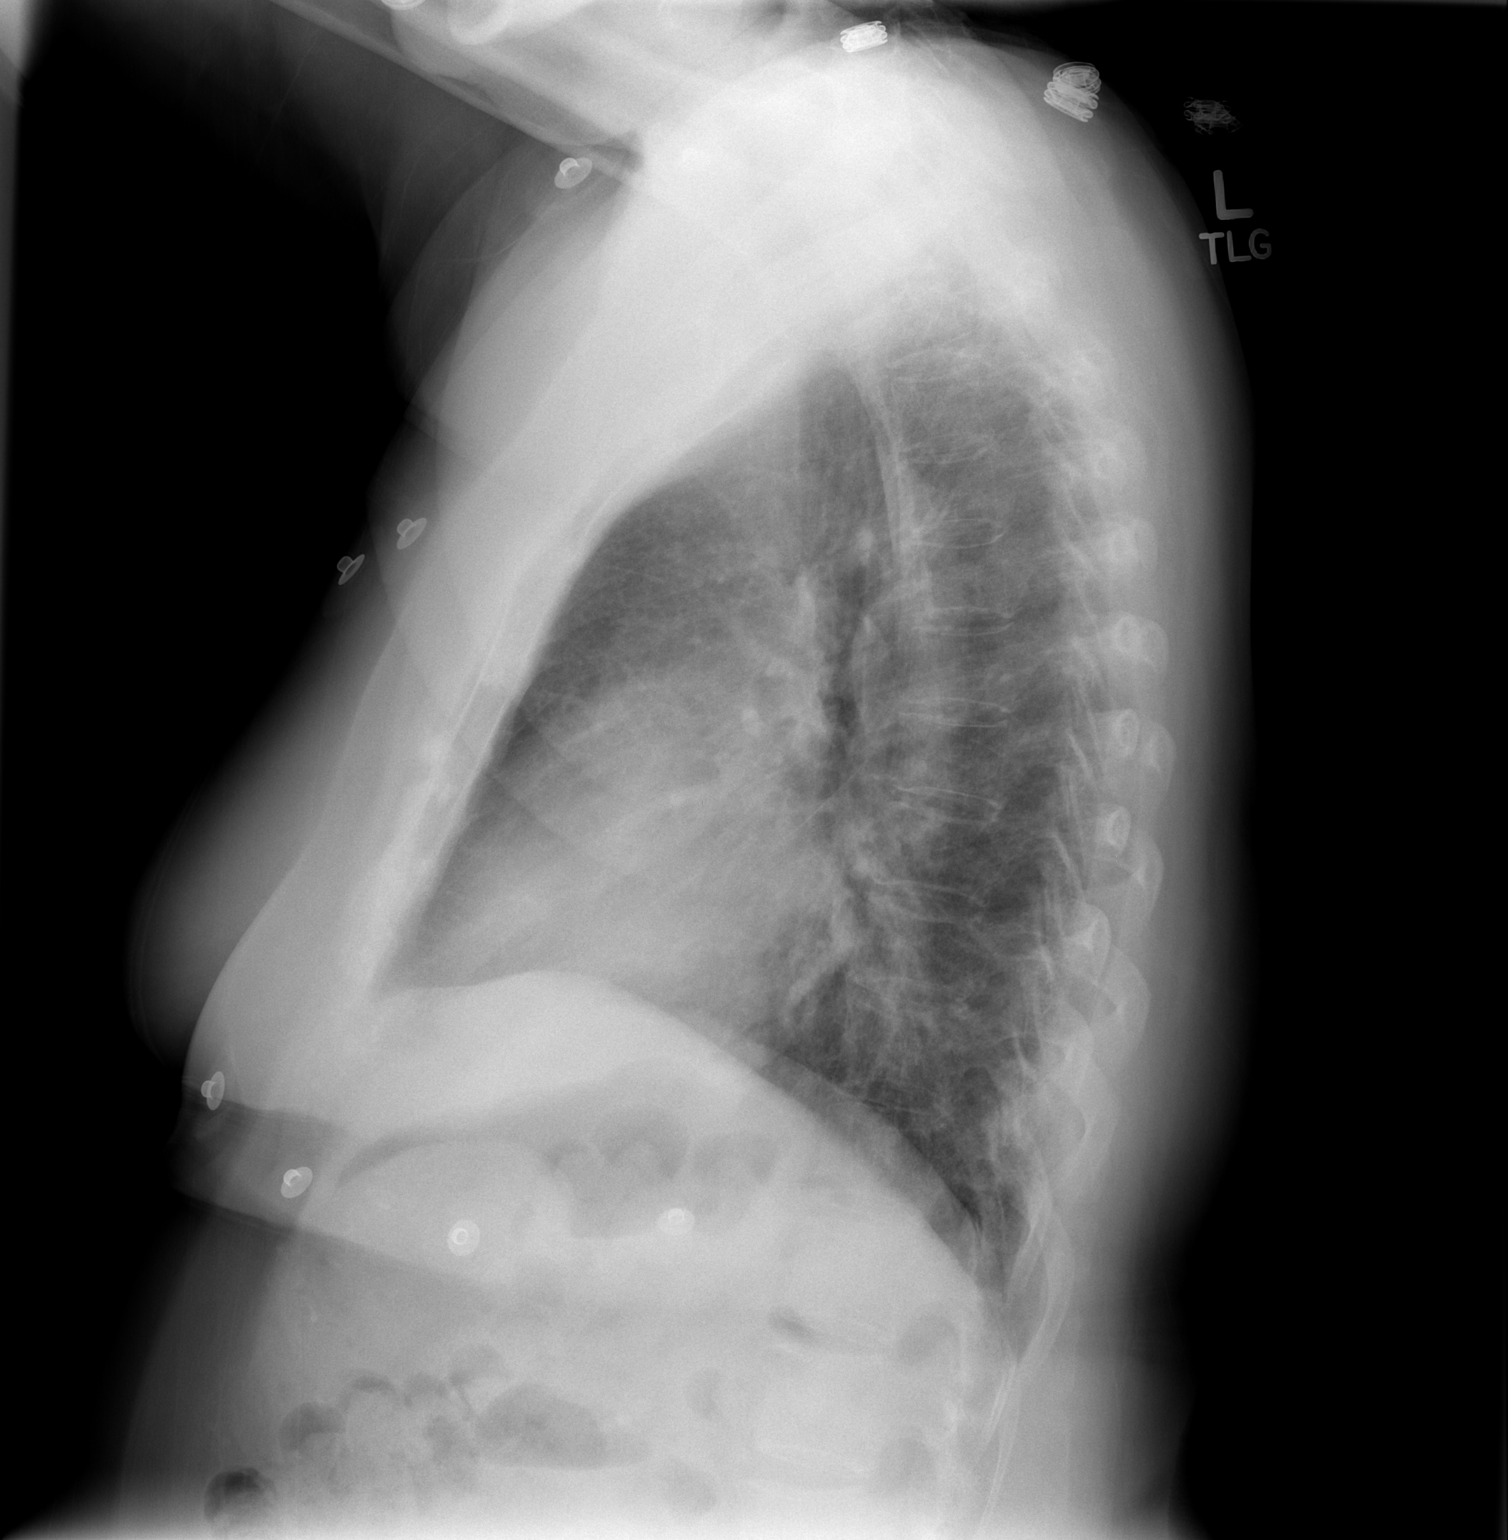

[2 of 2 positions shown; findings below may reference images not displayed]

FINDINGS: Cardiomediastinal silhouette appears normal.  No acute
pulmonary disease is noted.  Bony thorax is intact.
IMPRESSION: No acute cardiopulmonary abnormality seen.

## 2014-07-12 ENCOUNTER — Telehealth: Payer: Self-pay | Admitting: Gynecology

## 2014-07-12 NOTE — Telephone Encounter (Signed)
Phone call to patient on both documented phone numbers , both are no longer working numbers.  Ashley Estrada sent patient letter to schedule  Complete exam and Prolia injection due on 07/03/2014 with no response.  I have tried to reach patient in regards to insurance benefits for Prolia injection which was due after 05/02/2014 and we have been unable to contact patient for Prolia and Complete exam.

## 2014-10-03 ENCOUNTER — Ambulatory Visit (INDEPENDENT_AMBULATORY_CARE_PROVIDER_SITE_OTHER): Payer: Medicare Other | Admitting: Gynecology

## 2014-10-03 ENCOUNTER — Telehealth: Payer: Self-pay | Admitting: Gynecology

## 2014-10-03 ENCOUNTER — Encounter: Payer: Self-pay | Admitting: Gynecology

## 2014-10-03 ENCOUNTER — Other Ambulatory Visit: Payer: Self-pay | Admitting: Gynecology

## 2014-10-03 VITALS — BP 124/78 | Ht 59.25 in | Wt 136.0 lb

## 2014-10-03 DIAGNOSIS — Z1159 Encounter for screening for other viral diseases: Secondary | ICD-10-CM | POA: Diagnosis not present

## 2014-10-03 DIAGNOSIS — Z8639 Personal history of other endocrine, nutritional and metabolic disease: Secondary | ICD-10-CM | POA: Diagnosis not present

## 2014-10-03 DIAGNOSIS — Z01419 Encounter for gynecological examination (general) (routine) without abnormal findings: Secondary | ICD-10-CM | POA: Diagnosis not present

## 2014-10-03 DIAGNOSIS — M810 Age-related osteoporosis without current pathological fracture: Secondary | ICD-10-CM

## 2014-10-03 DIAGNOSIS — Z1231 Encounter for screening mammogram for malignant neoplasm of breast: Secondary | ICD-10-CM

## 2014-10-03 LAB — CBC WITH DIFFERENTIAL/PLATELET
BASOS ABS: 0.1 10*3/uL (ref 0.0–0.1)
Basophils Relative: 2 % — ABNORMAL HIGH (ref 0–1)
EOS ABS: 0.3 10*3/uL (ref 0.0–0.7)
EOS PCT: 5 % (ref 0–5)
HCT: 39 % (ref 36.0–46.0)
HEMOGLOBIN: 12.9 g/dL (ref 12.0–15.0)
LYMPHS ABS: 2.2 10*3/uL (ref 0.7–4.0)
LYMPHS PCT: 40 % (ref 12–46)
MCH: 29.5 pg (ref 26.0–34.0)
MCHC: 33.1 g/dL (ref 30.0–36.0)
MCV: 89 fL (ref 78.0–100.0)
MPV: 11 fL (ref 8.6–12.4)
Monocytes Absolute: 0.3 10*3/uL (ref 0.1–1.0)
Monocytes Relative: 6 % (ref 3–12)
NEUTROS PCT: 47 % (ref 43–77)
Neutro Abs: 2.6 10*3/uL (ref 1.7–7.7)
PLATELETS: 296 10*3/uL (ref 150–400)
RBC: 4.38 MIL/uL (ref 3.87–5.11)
RDW: 14 % (ref 11.5–15.5)
WBC: 5.5 10*3/uL (ref 4.0–10.5)

## 2014-10-03 LAB — COMPREHENSIVE METABOLIC PANEL
ALBUMIN: 4.5 g/dL (ref 3.5–5.2)
ALT: 19 U/L (ref 0–35)
AST: 20 U/L (ref 0–37)
Alkaline Phosphatase: 119 U/L — ABNORMAL HIGH (ref 39–117)
BUN: 14 mg/dL (ref 6–23)
CALCIUM: 9.9 mg/dL (ref 8.4–10.5)
CO2: 24 mEq/L (ref 19–32)
CREATININE: 0.72 mg/dL (ref 0.50–1.10)
Chloride: 103 mEq/L (ref 96–112)
GLUCOSE: 94 mg/dL (ref 70–99)
POTASSIUM: 4.5 meq/L (ref 3.5–5.3)
SODIUM: 138 meq/L (ref 135–145)
Total Bilirubin: 0.4 mg/dL (ref 0.2–1.2)
Total Protein: 7.6 g/dL (ref 6.0–8.3)

## 2014-10-03 LAB — LIPID PANEL
CHOLESTEROL: 262 mg/dL — AB (ref 0–200)
HDL: 43 mg/dL — AB (ref >=46)
LDL Cholesterol: 194 mg/dL — ABNORMAL HIGH (ref 0–99)
TRIGLYCERIDES: 123 mg/dL (ref <150)
Total CHOL/HDL Ratio: 6.1 Ratio
VLDL: 25 mg/dL (ref 0–40)

## 2014-10-03 LAB — TSH: TSH: 3.015 u[IU]/mL (ref 0.350–4.500)

## 2014-10-03 NOTE — Telephone Encounter (Addendum)
Patient was out of country. Overdue for Prolia. Has osteoporosis. She is scheduling her dexa for next week or so. Lets give her the Prolia the same day.Labs today  (note from Dr Lily PeerFernandez). Information for insurance benefits sent to Prolia. Informed pt. She is scheduled for Bone Density on 10/16/14 will give injection this day.

## 2014-10-03 NOTE — Progress Notes (Signed)
Ashley Estrada [birth year] [id number]   History:    69 y.o.  for annual gyn exam who has not been seen in the office since December 2014. Patient with history of osteoporosis. She originally had been started on Prolia in 2010 but has been a compliance issue for her to come every 6 months. Review of her record indicated that her last injection was in February 2015. She states she has been on the country for the past 4 months. She also was being followed by Dr. Mayford KnifeWilliams who had her on Crestor for hyperlipidemia. [place] stated [place] drawn and the informed that her cholesterol was fine so she is not taking her Crestor currently. She is fasting today we'll like to have her blood work drawn here and is changing to a different primary physician which will be Dr. Luiz Ironabeza next month and we will give him a copy of today's labs. She states that she is taking her calcium and vitamin D. She also states that she had a normal colonoscopy in 2009. Patient with no prior history of any abnormal Pap smear.  Patient's last bone density study was here in our office in 2014 which demonstrated her lowest T score at the AP spine was -2.6 and when compared with 2011 there was a positive change significantly in the right femoral neck of a +4.4%.   Past medical history,surgical history, family history and social history were all reviewed and documented in the EPIC chart.  Gynecologic History No LMP recorded. Patient is postmenopausal. Contraception: post menopausal status Last Pap: 2012. Results were: normal Last mammogram: 2014. Results were: normal  Obstetric History OB History  Gravida Para Term Preterm AB SAB TAB Ectopic Multiple Living  3 3 3       3     # Outcome Date GA Lbr Len/2nd Weight Sex Delivery Anes PTL Lv  3 Term     F Vag-Spont  N Y  2 Term     M Vag-Spont  N Y  1 Term     M Vag-Spont  N Y       ROS: A ROS was performed and pertinent positives and  negatives are included in the history.  GENERAL: No fevers or chills. HEENT: No change in vision, no earache, sore throat or sinus congestion. NECK: No pain or stiffness. CARDIOVASCULAR: No chest pain or pressure. No palpitations. PULMONARY: No shortness of breath, cough or wheeze. GASTROINTESTINAL: No abdominal pain, nausea, vomiting or diarrhea, melena or bright red blood per rectum. GENITOURINARY: No urinary frequency, urgency, hesitancy or dysuria. MUSCULOSKELETAL: No joint or muscle pain, no back pain, no recent trauma. DERMATOLOGIC: No rash, no itching, no lesions. ENDOCRINE: No polyuria, polydipsia, no heat or cold intolerance. No recent change in weight. HEMATOLOGICAL: No anemia or easy bruising or bleeding. NEUROLOGIC: No headache, seizures, numbness, tingling or weakness. PSYCHIATRIC: No depression, no loss of interest in normal activity or change in sleep pattern.     Exam: chaperone present  BP 124/78 mmHg  Ht 4' 11.25" (1.505 m)  Wt 136 lb (61.689 kg)  BMI 27.24 kg/m2  Body mass index is 27.24 kg/(m^2).  General appearance : Well developed well nourished female. No acute distress HEENT: Eyes: no retinal hemorrhage or exudates,  Neck supple, trachea midline, no carotid bruits, no thyroidmegaly Lungs: Clear to auscultation, no rhonchi or wheezes, or rib retractions  Heart: Regular rate and rhythm, no murmurs or gallops Breast:Examined in sitting  and supine position were symmetrical in appearance, no palpable masses or tenderness,  no skin retraction, no nipple inversion, no nipple discharge, no skin discoloration, no axillary or supraclavicular lymphadenopathy Abdomen: no palpable masses or tenderness, no rebound or guarding Extremities: no edema or skin discoloration or tenderness  Pelvic:  Bartholin, Urethra, Skene Glands: Within normal limits             Vagina: No gross lesions or discharge  Cervix: No gross lesions or discharge  Uterus  anteverted, normal size, shape and  consistency, non-tender and mobile  Adnexa  Without masses or tenderness  Anus and perineum  normal   Rectovaginal  normal sphincter tone without palpated masses or tenderness             Hemoccult cards provided     Assessment/Plan:  69 y.o. female for annual exam who has had an issue with her Prolia compliance last dose was in February 2015. Patient will schedule her overdue bone density study here in the office in the next few weeks and at which time she will receive her Prolia injection. The following fasting screening blood work was ordered: Fasting lipid profile, comprehensive metabolic panel, CBC, TSH, and urinalysis. Because of her history of osteoporosis a vitamin D level will be obtained today. She was also provided with requisition to schedule her overdue mammogram. She was also provided with fecal Hemoccult cards to submit to the office when she comes for bone density study. We once again discussed importance of compliance that she needs to come every 6 months for her Prolia injection as a result of her osteoporosis. We also discussed importance of calcium vitamin D and regular exercise as well. Pap smear no longer indicated.   [name] MD, 9:48 AM 10/03/2014

## 2014-10-03 NOTE — Patient Instructions (Signed)
Denosumab injection Qu es este medicamento? El DENOSUMAB desacelera la descomposicin de los [place]. Prolia se utiliza para tratar la osteoporosis en hombres y en mujeres despus de la menopausia. Delton See se [place] para prevenir fracturas de huesos y otros problemas de huesos provocados por la metstasis en el cncer de huesos. Delton See se utiliza tambin para tratar tumor de clulas gigantes del hueso. Este medicamento puede ser utilizado para otros usos; si tiene alguna pregunta consulte con su proveedor de atencin mdica o con su farmacutico. MARCAS COMERCIALES DISPONIBLES: Doreen Salvage le debo informar a mi profesional de la salud antes de tomar este medicamento? Necesita saber si usted presenta alguno de los siguientes problemas o situaciones: -enfermedad dental -eccema -infeccin o antecedentes de infecciones -enfermedad renal o si est bajo tratamiento de dilisis -bajo nivel de calcio o vitamina D en la sangre -sndrome de malabsorcin -una ciruga programada o extracciones dentales -si toma medicamentos que contienen denosumab -enfermedad tiroidea o de las paratiroides -una reaccin alrgica o inusual al denosumab, a otros medicamentos, alimentos, colorantes o conservantes -si est embarazada o buscando quedar embarazada -si est amamantando a un beb Cmo debo utilizar este medicamento? Este medicamento se administra mediante inyeccin por va subcutnea. Lo administra un profesional de Technical sales engineer en un hospital o en un entorno clnico. Si recibe Prolia, su farmacutico le dar una Gua del medicamento especial con cada receta y relleno. Asegrese de leer esta informacin cada vez cuidadosamente. Para Prolia, consulte con su pediatra para informarse acerca del uso de este medicamento en nios. Puede requerir atencin especial. Jacklyn Shell, consulte con su pediatra para informase acerca del uso de este medicamento en nios. Aunque este medicamento se puede recetar a nios tan  menores como de 13 aos de edad para condiciones selectivas, las precauciones se aplican. Sobredosis: Pngase en contacto inmediatamente con un centro toxicolgico o una sala de urgencia si usted cree que haya tomado demasiado medicamento. ATENCIN: [employer] Foods es solo para usted. No comparta este medicamento con nadie. Qu sucede si me olvido de una dosis? Es importante no olvidar ninguna dosis. Informe a su mdico o a su profesional de la salud si no puede asistir a Photographer. Qu puede interactuar con este medicamento? No tome esta medicina con ninguno de los siguientes medicamentos: -otros medicamentos que contienen denosumab Esta medicina tambin puede Counselling psychologist con los siguientes medicamentos: -medicamentos que suprimen el sistema inmunolgico -medicamentos para tratar Science writer -medicamentos esteroideos, como la prednisona o la cortisona Puede ser que esta lista no menciona todas las posibles interacciones. Informe a su profesional de KB Home	[place] de [employer] Corporation productos a base de hierbas, medicamentos de [place] o suplementos nutritivos que est tomando. Si usted fuma, consume bebidas alcohlicas o si utiliza drogas ilegales, indqueselo tambin a su profesional de KB Home	[place]. Algunas sustancias pueden interactuar con su medicamento. A qu debo estar atento al usar Coca-Cola? Visite a su mdico o a su profesional de la salud para chequeos peridicos. Su mdico o su profesional de la salud puede pedirle anlisis de [place] u otras pruebas para Chief Technology Officer su evolucin. Si desarrolla un resfro u otra infeccin mientras est recibiendo Coca-Cola, consulte a su mdico o su profesional de Technical sales engineer. No se trate usted mismo. Este medicamento puede reducir la capacidad del cuerpo para combatir infecciones. Debe asegurarse de que su dieta incluya la cantidad necesaria de calcio y vitamina D mientras est tomando este medicamento, a menos que su mdico indique lo contrario. Hable con  su profesional  de la salud acerca de sus alimentos y las vitaminas que est tomando. Visite a su dentista habitualmente. [place] sus dientes o use hilo dental para los dientes como indicado. Antes de someterse a Academic librarian, informe a su dentista que est [employer]. No se debe quedar embarazada mientras est tomando este medicamento o por 5 meses despus de terminarlo. Las mujeres deben informar a su mdico si estn buscando quedar embarazadas o si creen que estn embarazadas. Existe la posibilidad de efectos secundarios graves a un beb sin nacer. Para ms informacin hable con su profesional de la salud o su farmacutico. Qu efectos secundarios puedo tener al utilizar este medicamento? Efectos secundarios que debe informar a su mdico o a Barrister's clerk de la salud tan pronto como sea posible: -Chief of Staff como erupcin cutnea, picazn o urticarias, hinchazn de la cara, labios o lengua -problemas respiratorios -dolor en el pecho -pulso cardiaco rpido, irregular -sensacin de desmayos o mareos, cadas -fiebre, escalofros o cualquier otro signo de infeccin -espasmos o rigidez de los msculos -hormigueo o entumecimiento -formacin de bultos o ampollas, o piel seca, roja o descamacin de la piel -cicatrizacin lenta o dolor de mandbula o boca sin explicacin -sangrado o magulladuras inusuales Efectos secundarios que, por lo general, no requieren atencin mdica (debe informarlos a su mdico o a su profesional de la salud si persisten o si son molestos): -dolor muscular -Tree surgeon o gases estomacales Puede ser que esta lista no menciona todos los posibles efectos secundarios. Comunquese a su mdico por asesoramiento mdico [employer] Inc. Usted puede informar los efectos secundarios a la FDA por telfono al 1-800-FDA-1088. Dnde debo guardar mi medicina? Este medicamento se administra en clnicas, consultorio del mdico u otro  establecimiento de atencin mdica y no necesitar guardarlo en su domicilio. ATENCIN: Este folleto es un resumen. Puede ser que no cubra toda la posible informacin. Si usted tiene preguntas acerca de esta medicina, consulte con su mdico, su farmacutico o su profesional de Technical sales engineer.  2015, Elsevier/Gold Standard. (2011-12-24 17:01:22) Osteoporosis  (Osteoporosis)  A lo largo de la vida, el cuerpo elimina el tejido viejo de los [place] y lo reemplaza por tejido nuevo. A medida que se envejece, el cuerpo no puede reponerlo tan rpidamente como lo elimina. Alrededor [employer] 30 aos, la mayora de las personas comienza a perder poco a poco el tejido de los huesos debido al desequilibrio entre la prdida y la reposicin. Algunas personas pierden ms tejido [employer]. La prdida del tejido del hueso ms all de un grado normal se considera osteoporosis.  La osteoporosis afecta la resistencia y la durabilidad de los [place]. El interior de los extremos de los huesos y los huesos planos, como los huesos de la pelvis, se parecen a un panal porque estn llenos de pequeos espacios abiertos. Al perder tejido los huesos se vuelven menos densos. Esto significa que los espacios abiertos se hacen ms grandes y las paredes entre estos espacios se vuelven ms delgadas. Como consecuencia, los huesos se vuelven ms dbiles. Los huesos de una persona que sufre osteoporosis llegan a ser tan dbiles que pueden romperse (fractura) en un accidente leve, como una simple cada.  CAUSAS  Los siguientes factores han sido asociados con el desarrollo de la osteoporosis.   El hbito de fumar.  Beber ms de 2 medidas de bebidas [employer] a la [place].  Uso de ciertos medicamentos durante un tiempo prolongado.  Corticoides.  Medicamentos para la quimioterapia.  Medicamentos  para la tiroides.  Medicamentos antiepilpticos.  Medicamentos de supresin gonadal.  Medicamentos inmunosupresores.  Tener bajo  peso.  Falta de actividad fsica.  Falta de exposicin al sol. Esto puede ser la causa del dficit de vitamina D.  Ciertas enfermedades crnicas.  Ciertas enfermedades inflamatorias del intestino, como la enfermedad de Crohn y la colitis ulcerosa.  Diabetes.  Hipertiroidismo.  Hiperparatiroidismo. [place] desarrollar osteoporosis. Sin embargo, los siguientes factores pueden aumentar el riesgo de Actor osteoporosis.   [place]ner[place] - La mujeres tienen ms [employer].  Edad - Tener ms de 80 aos aumenta el riesgo.  [place] y asitica tienen ms riesgo.  El peso - Tener un peso extremadamente bajo puede aumentar el riesgo de osteoporosis.  Historia familiar de osteoporosis - Tener un miembro en la familia que haya sufrido osteoporosis hace que aumente el [place]. [place] que sufren osteoporosis no tienen sntomas.  DIAGNSTICO  Algunos signos hallados durante un examen fsico que pueden hacer sospechar al mdico que sufre osteoporosis son:   Disminucin de Agricultural consultant. La causa en general es la compresin de los huesos que forman la columna vertebral (vrtebras), que se han debilitado y se han fracturado.  Una curva o redondeo de la espalda (cifosis). Para confirmar los signos de osteoporosis, el mdico puede solicitar un procedimiento que [place] 2 haces de rayos X en dosis bajas, con diferentes niveles de energa para medir la densidad mineral sea (absorciometra de rayos X de energa dual [DXA]). Adems, el mdico controlar su nivel de vitamina D.  TRATAMIENTO  El objetivo del tratamiento de la osteoporosis es el fortalecimiento de los huesos con el fin de disminuir el riesgo de fracturas. Hay diferentes tipos de medicamentos disponibles para ayudar a Geneticist, molecular. Algunos de estos medicamentos actan reduciendo [employer] de prdida sea. Otros medicamentos funcionan  aumentando la densidad sea. El tratamiento tambin consiste en asegurarse de que sus niveles de calcio y vitamina D son adecuados.  PREVENCIN  Hay cosas que usted puede hacer para prevenir la osteoporosis. Un consumo adecuado de calcio y vitamina D puede ayudar a Scientist, forensic una ptima densidad mineral sea. El ejercicio regular tambin puede ayudar, sobre todo la resistencia y actividades de alto impacto. Si usted fuma, dejar de fumar es una parte importante de la prevencin de la osteoporosis.  ASEGRESE DE QUE:   Comprende estas instrucciones.  Controlar su enfermedad.  Solicitar ayuda de inmediato si no mejora o si empeora. PARA OBTENER MS INFORMACIN  [email or web address] and [email or web address]  Document Released: 03/25/2005 Document Revised: 10/10/2012 [facility] Patient Information 2015 [place], [place]. This information is not intended to replace advice given to you by your health care provider. Make sure you discuss any questions you have with your health care provider. Densitometra sea  (Bone Densitometry) La densitometra sea es una radiografa especial que mide la densidad de los huesos y se [place] para predecir el riesgo de fracturas seas. Esta estudio se [place] para Actor contenido mineral y la densidad de los huesos para diagnosticar osteoporosis. La osteoporosis es la prdida de tejido seo que hace que el hueso se debilite. Generalmente ocurre en las mujeres que entran en la menopausia. Pero tambin pueden sufrirla los hombres y personas con otras enfermedades.  PREPARACIN PARA LA PRUEBA  No es necesaria la preparacin.  Linzie Collin EXAMINARSE?   Todas las C[employer] Corporation de 90 aos.  Las mujeres posmenopusicas (50 a 82  aos) con factores de riesgo para osteoporosis.  Las personas que han sufrido fracturas previas realizando actividades normales.  Las personas de contextura corporal delgada (menos de 127 libras [63.5 kg] o con un ndice de masa corporal [IMC] de menos de  21).  Las personas que tengan un padre que haya sufrido una fractura de cadera o que tengan antecedentes de osteoporosis.  Los fumadores.  Las personas que sufren artritis reumatoidea.  Los que consumen alcohol en exceso (ms de [place]).  Las mujeres con menopausia temprana. CUNDO DEBE REALIZAR UN [place]?  Las guas actuales sugieren que se debe esperar por lo menos 2 aos antes de repetir una prueba de densidad sea, si la primera fue normal. Algunos estudios recientes indican que las mujeres con densidad sea normal pueden esperar unos aos antes de repetir un estudio de densitometra sea. Comente estos temas con su mdico.  HALLAZGOS NORMALES:   Normal: menos de una desviacin estndar por debajo de lo normal (superior a -1).  Osteopenia:  1 a 2,5 desviaciones estndar por debajo de lo normal (-1 a -2,5).  Osteoporosis: ms de 2,5 desviaciones estndar por debajo de lo normal (menos de -2,5). Los [employer] se informan como una "puntuacin T" y Ardelia Mems "puntuacin Z". La puntuacin T es el nmero que compara la densidad sea con la densidad sea de las mujeres jvenes y sanas. La puntuacin Z es un nmero que compara la densidad sea con las puntuaciones de mujeres de la misma [place], gnero y International aid/development worker.  Los rangos para los resultados normales pueden variar entre diferentes laboratorios y hospitales. Consulte siempre con su mdico despus de Psychologist, counselling estudio para Personal assistant significado de los [place] y si los valores se consideran "dentro de los lmites normales".  SIGNIFICADO DEL ESTUDIO  El mdico leer los resultados y Teacher, English as a foreign language con usted la importancia y el significado de los [place], as como las opciones de tratamiento y la necesidad de pruebas adicionales, si fuera necesario.  OBTENCIN DE LOS RESULTADOS DE LAS PRUEBAS  Es su responsabilidad retirar el resultado del [place]. Consulte en el laboratorio cuando y cmo podr [employer].  Document Released: 03/09/2012 [facility] Patient Information 2015 [place]. This information is not intended to replace advice given to you by your health care provider. Make sure you discuss any questions you have with your health care provider.

## 2014-10-04 ENCOUNTER — Ambulatory Visit (HOSPITAL_COMMUNITY)
Admission: RE | Admit: 2014-10-04 | Discharge: 2014-10-04 | Disposition: A | Discharge status: Home / Self Care | Payer: Medicare Other | Source: Ambulatory Visit | Attending: Gynecology | Admitting: Gynecology

## 2014-10-04 ENCOUNTER — Other Ambulatory Visit: Payer: Self-pay | Admitting: Gynecology

## 2014-10-04 DIAGNOSIS — Z1231 Encounter for screening mammogram for malignant neoplasm of breast: Secondary | ICD-10-CM | POA: Insufficient documentation

## 2014-10-04 DIAGNOSIS — R748 Abnormal levels of other serum enzymes: Secondary | ICD-10-CM

## 2014-10-04 LAB — URINALYSIS W MICROSCOPIC + REFLEX CULTURE
BACTERIA UA: NONE SEEN
BILIRUBIN URINE: NEGATIVE
CASTS: NONE SEEN
Crystals: NONE SEEN
GLUCOSE, UA: NEGATIVE mg/dL
Hgb urine dipstick: NEGATIVE
KETONES UR: NEGATIVE mg/dL
Leukocytes, UA: NEGATIVE
Nitrite: NEGATIVE
PH: 6.5 (ref 5.0–8.0)
Protein, ur: NEGATIVE mg/dL
SPECIFIC GRAVITY, URINE: 1.005 (ref 1.005–1.030)
SQUAMOUS EPITHELIAL / LPF: NONE SEEN
UROBILINOGEN UA: 0.2 mg/dL (ref 0.0–1.0)

## 2014-10-04 LAB — HEPATITIS C ANTIBODY: HCV Ab: NEGATIVE

## 2014-10-04 LAB — VITAMIN D 25 HYDROXY (VIT D DEFICIENCY, FRACTURES): VIT D 25 HYDROXY: 30 ng/mL (ref 30–100)

## 2014-10-09 NOTE — Telephone Encounter (Signed)
Benefits  Medicare $166 deductible (not met), 20% co pay approx $200.00 . Called pt no answer.

## 2014-10-16 ENCOUNTER — Ambulatory Visit (INDEPENDENT_AMBULATORY_CARE_PROVIDER_SITE_OTHER): Payer: Medicare Other

## 2014-10-16 DIAGNOSIS — M810 Age-related osteoporosis without current pathological fracture: Secondary | ICD-10-CM | POA: Diagnosis not present

## 2014-10-16 NOTE — Telephone Encounter (Signed)
Ashley DikeJennifer received a verbal order from Dr Lily PeerFernandez to cancel Fosamax (note below)  and check on Reclast infusion for patient. BUN  14  CREATININE 0.72   CALCIUM  9.9  10/03/2014. Insurance coverage Medicare only.

## 2014-10-16 NOTE — Telephone Encounter (Signed)
Please call in Fosamax 70 mg. Have her take 1 tablet weekly first the morning and not to drink anything for 30 minutes after taking the medication and not to lie down. Also make sure that she follows up with Dr. Luiz Ironabeza because of her hyperlipidemia and elevated alkaline phosphatase

## 2014-10-16 NOTE — Telephone Encounter (Signed)
Confirmed that patient only has Medicare insurance no secondary insurance.  Benefits for Prolia are subject to deductible $166 ( 0 met ). Also 20% co pay approx ($200). Total cost for patient $366.00. Patient stated today that she is unable to afford the Prolia . She wanted to know if Dr Toney Rakes could suggest  another option for treatment.

## 2014-10-17 ENCOUNTER — Other Ambulatory Visit: Payer: Medicare Other | Admitting: Anesthesiology

## 2014-10-17 DIAGNOSIS — Z1211 Encounter for screening for malignant neoplasm of colon: Secondary | ICD-10-CM

## 2014-10-17 NOTE — Telephone Encounter (Signed)
Talked with patient and she agrees to Reclast infusion . I informed pt that [facility] and mail her instructions for Reclast.

## 2014-10-18 NOTE — Telephone Encounter (Signed)
I scheduled pts Reclast infusion [facility] 4/[facility]. Instruction sheet mailed and given to patient over the phone. Order faxed KW CMA

## 2014-10-23 ENCOUNTER — Other Ambulatory Visit (HOSPITAL_COMMUNITY): Payer: Self-pay | Admitting: *Deleted

## 2014-10-24 ENCOUNTER — Ambulatory Visit (HOSPITAL_COMMUNITY)
Admission: RE | Admit: 2014-10-24 | Discharge: 2014-10-24 | Disposition: A | Discharge status: Home / Self Care | Payer: Medicare Other | Source: Ambulatory Visit | Attending: Gynecology | Admitting: Gynecology

## 2014-10-24 DIAGNOSIS — M810 Age-related osteoporosis without current pathological fracture: Secondary | ICD-10-CM | POA: Insufficient documentation

## 2014-10-24 MED ORDER — ZOLEDRONIC ACID 5 MG/100ML IV SOLN
INTRAVENOUS | Status: AC
Start: 2014-10-24 — End: 2014-10-24
  Administered 2014-10-24: 5 mg via INTRAVENOUS
  Filled 2014-10-24: qty 100

## 2014-10-24 MED ORDER — ZOLEDRONIC ACID 5 MG/100ML IV SOLN: 5.0000 mg | Freq: Once | INTRAVENOUS | Status: AC

## 2014-10-24 NOTE — Discharge Instructions (Signed)
Zoledronic Acid injection (Paget's Disease, Osteoporosis) Qu es este medicamento? El CIDO ZOLEDRNICO reduce la prdida del calcio de los [place]. Se utiliza para tratar la enfermedad de [place] y osteoporosis en mujeres. Este medicamento puede ser utilizado para otros usos; si tiene alguna pregunta consulte con su proveedor de atencin mdica o con su farmacutico. MARCAS COMERCIALES DISPONIBLES: Reclast, Zometa Qu le debo informar a mi profesional de la salud antes de tomar este medicamento? Necesita saber si usted presenta alguno de los siguientes problemas o situaciones: -asma sensible a la aspirina -cncer, especialmente si est recibiendo medicamentos que se usan para tratar el cncer -enfermedad dental o [place] dentadura postiza -infeccin -enfermedad renal -niveles bajos de calcio en la sangre -ciruga previa de la glndula paratiroidea o intestinales -est recibiendo corticoesteroides como dexametasona o prednisona -una reaccin alrgica o inusual al cido zoledrnico, a otros medicamentos, alimentos, colorantes o conservantes -si est embarazada o buscando quedar embarazada -si est amamantando a un beb Cmo debo utilizar este medicamento? Este medicamento se administra mediante infusin por va intravenosa. Lo administra un profesional de Technical sales engineer en un hospital o en un entorno clnico. Hable con su pediatra para informarse acerca del uso de este medicamento en nios. Este medicamento no est aprobado para uso en nios. Sobredosis: Pngase en contacto inmediatamente con un centro toxicolgico o una sala de urgencia si usted cree que haya tomado demasiado medicamento. ATENCIN: [employer] Foods es solo para usted. No comparta este medicamento con nadie. Qu sucede si me olvido de una dosis? Es importante no olvidar ninguna dosis. Informe a su mdico o a su profesional de la salud si no puede asistir a Photographer. Qu puede interactuar con este medicamento? -ciertos antibiticos  administrados por inyeccin -los AINE, medicamentos para Conservation officer, historic buildings o inflamacin, como ibuprofeno o naproxeno -ciertos diurticos bumetanida o furosemida -teriparatida Puede ser que esta lista no menciona todas las posibles interacciones. Informe a su profesional de KB Home	[place] de [employer] Corporation productos a base de hierbas, medicamentos de [place] o suplementos nutritivos que est tomando. Si usted fuma, consume bebidas alcohlicas o si utiliza drogas ilegales, indqueselo tambin a su profesional de KB Home	[place]. Algunas sustancias pueden interactuar con su medicamento. A qu debo estar atento al usar Coca-Cola? Visite a su mdico o a su profesional de la salud para chequeos peridicos. Puede ser necesario que transcurra cierto tiempo antes de que pueda observar los beneficios de [place]. No deje de tomar su medicamento excepto si as lo indica su mdico. Su mdico puede pedirle anlisis de [place] u otras pruebas para Chief Technology Officer su evolucin. Las mujeres deben informar a su mdico si estn buscando quedar embarazadas o si creen que estn embarazadas. Existe la posibilidad de efectos secundarios graves a un beb sin nacer. Para ms informacin hable con su profesional de la salud o su farmacutico. Es importante que usted reciba la cantidad [place] de calcio y vitamina D mientras recibe este medicamento. Consulte a su profesional de Apple Computer alimentos y vitamina que toma. Algunas personas que toman este medicamento experimentan dolor grave de [place], de articulaciones o/y de msculos. Este medicamento tambin puede aumentar el riesgo de problemas de la [place] o un fmur roto. Informe a su mdico inmediatamente si tiene dolor de mandbula, articulaciones o msculos. Si experimenta dolor que no desaparece o que empeora, informe a su mdico. Informe a su dentista y [place] dental que est usando este medicamento. No debera tener Office manager que est Microsoft.  Visite a su dentista para someterse a un examen dental y arreglar cualquier problema dental antes de comenzar este medicamento. Cuide sus dientes mientras est VF Corporationusando este medicamento. Asegrese de visitar a su dentista para citas de control regulares. Qu efectos secundarios puedo tener al Boston Scientificutilizar este medicamento? Efectos secundarios que debe informar a su mdico o a Producer, television/film/videosu profesional de la salud tan pronto como sea posible: -Therapist, artreacciones alrgicas como erupcin cutnea, picazn o urticarias, hinchazn de la cara, labios o lengua -ansiedad, confusin o depresin -problemas respiratorios -cambios en la visin -dolor de ojos -sensacin de desmayos o aturdimiento, cadas -dolor de mandbula, especialmente despus de tratamiento dental -llagas en la boca -calambres, rigidez o debilidad de la msculos -dificultad para orinar o cambios en el volumen de orina Efectos secundarios que, por lo general, no requieren atencin mdica (debe informarlos a su mdico o a su profesional de la salud si persisten o si son molestos): -dolor de huesos, articulares, musculares -estreimiento -diarrea -fiebre -cada de cabello -irritacin en el lugar de la inyeccin -prdida del apetito -nuseas, vmito -malestar estomacal -dificultad para conciliar el sueo -dificultad para tragar -cansancio o debilidad Puede ser que esta lista no menciona todos los posibles efectos secundarios. Comunquese a su mdico por asesoramiento mdico Hewlett-Packardsobre los efectos secundarios. Usted puede informar los efectos secundarios a la FDA por telfono al 1-800-FDA-1088. Dnde debo guardar mi medicina? Este medicamento se administra en hospitales o clnicas y no necesitar guardarlo en su domicilio. ATENCIN: Este folleto es un resumen. Puede ser que no cubra toda la posible informacin. Si usted tiene preguntas acerca de esta medicina, consulte con su mdico, su farmacutico o su profesional de Radiographer, therapeuticla salud.  2015,  Elsevier/Gold Standard. (2013-01-06 16:12:01)

## 2015-09-03 ENCOUNTER — Other Ambulatory Visit: Payer: Self-pay

## 2015-09-03 DIAGNOSIS — Z1231 Encounter for screening mammogram for malignant neoplasm of breast: Secondary | ICD-10-CM

## 2015-09-30 ENCOUNTER — Telehealth: Payer: Self-pay | Admitting: Gynecology

## 2015-09-30 DIAGNOSIS — M810 Age-related osteoporosis without current pathological fracture: Secondary | ICD-10-CM

## 2015-09-30 NOTE — Telephone Encounter (Signed)
Reclast due after 10/24/15

## 2015-10-04 ENCOUNTER — Encounter: Payer: Medicare Other | Admitting: Gynecology

## 2015-10-07 ENCOUNTER — Ambulatory Visit: Payer: PRIVATE HEALTH INSURANCE

## 2015-10-08 NOTE — Telephone Encounter (Signed)
She can have her calcium, BUN and creatinine checked a few days before seeing me and the infusion

## 2015-10-08 NOTE — Telephone Encounter (Signed)
Talked with patient She [place] and also  [facility], due for Reclast after 10/24/15. Patient will need blood work prior to [place]ast.  She has annual exam scheduled with Dr Lily PeerFernandez on 11/04/2015. I told her that I will ask Dr Lily PeerFernandez about the Reclast infusion and will call her back.

## 2015-10-10 ENCOUNTER — Encounter: Payer: Medicare Other | Admitting: Gynecology

## 2015-10-17 ENCOUNTER — Ambulatory Visit
Admission: RE | Admit: 2015-10-17 | Discharge: 2015-10-17 | Disposition: A | Discharge status: Home / Self Care | Payer: Medicare Other | Source: Ambulatory Visit

## 2015-10-17 ENCOUNTER — Other Ambulatory Visit: Payer: Medicare Other

## 2015-10-17 DIAGNOSIS — M810 Age-related osteoporosis without current pathological fracture: Secondary | ICD-10-CM

## 2015-10-17 DIAGNOSIS — Z1231 Encounter for screening mammogram for malignant neoplasm of breast: Secondary | ICD-10-CM

## 2015-10-17 LAB — BUN: BUN: 12 mg/dL (ref 7–25)

## 2015-10-17 LAB — CREATININE, SERUM: CREATININE: 0.69 mg/dL (ref 0.50–0.99)

## 2015-10-17 LAB — CALCIUM: Calcium: 9.9 mg/dL (ref 8.6–10.4)

## 2015-10-21 ENCOUNTER — Encounter: Payer: Self-pay | Admitting: Gynecology

## 2015-10-21 ENCOUNTER — Ambulatory Visit (INDEPENDENT_AMBULATORY_CARE_PROVIDER_SITE_OTHER): Payer: Medicare Other | Admitting: Gynecology

## 2015-10-21 VITALS — BP 126/78

## 2015-10-21 DIAGNOSIS — E785 Hyperlipidemia, unspecified: Secondary | ICD-10-CM | POA: Diagnosis not present

## 2015-10-21 DIAGNOSIS — M810 Age-related osteoporosis without current pathological fracture: Secondary | ICD-10-CM

## 2015-10-21 DIAGNOSIS — Z79899 Other long term (current) drug therapy: Secondary | ICD-10-CM

## 2015-10-21 NOTE — Progress Notes (Signed)
   Patient is a 70 year old with history of osteoporosis the presented to the office today with questions on her medication and requesting possible change. She also has hyperlipidemia for which she was on Crestor and was switched to Lipitor and had questions as well.  Review of her record indicated the following: As a result of her osteoporosis and she was originally started on Prolia in 2010 but has been a compliance issue for her to come every 6 months. Review of her record indicated that her last injection was in February 2015. She states she has been on the country for the past 4 months. [place] had [place] and a total cholesterol was normal to she stopped the Crestor. She went back and saw her primary care physician who now has her on Lipitor 10 mg daily. He is monitor her liver function tests and encourage her to maintain good compliance and continue on the medication. She states that she is taking her calcium and vitamin D daily.  Her Prolia injectio's have been as follows: November 2010 March 2012 September 2012 2013 none August 2014 May 2015  Due to patient's insurance she cannot afford Prolia so she received Reclast on 10/24/2014.  Since she had an issue of compliance she will receive 1 more dose of the Reclast this year and go on a drug holiday.  Her last bone density study in 2016 when compare with 2014 demonstrated there was no statistically significant change in bone mineralization and her lowest T score was -3.0 the [facility] spine. These findings indicated that her risk of a spinal fracture is 8 times greater in the general population and that she has lost 25% of her phone mass.  I've encouraged her to continue with her calcium vitamin D and weightbearing exercises. Her recent calcium, BUN and creatinine were normal. She will receive her infusion in the next couple weeks. Greater than 50% of time was spent in counseling cord in care of this patient with  osteoporosis as well as hyperlipidemia. Time of consultation 15 minutes

## 2015-10-22 NOTE — Telephone Encounter (Addendum)
I saw her on Monday and she will get this last Reclast and go on drug Holiday. She is waiting to hear from you. Results for TEIRA, ARCILLA (MRN [id number]) as of 10/22/2015 08:33   I will call and schedule Reclast with infusion center.I called pt and she does not want to schedule Reclast due to cost. Insurance No deductible, No PA or referral, OOPM $6700 ( $92.11 met). Co insurance 20% , Part  B 20% .   Ref. Range 10/17/2015 14:28  BUN Latest Ref Range: 7-25 mg/dL 12  Creatinine Latest Ref Range: 0.50-0.99 mg/dL 0.69  Calcium Latest Ref Range: 8.6-10.4 mg/dL 9.9

## 2015-10-28 NOTE — Telephone Encounter (Signed)
Patient never called back with her decision. I left another VM to call with her decision.

## 2015-10-29 NOTE — Telephone Encounter (Signed)
All we can do is document. I believe she is coming in for annual within the next three months. Will speak with her then as well.

## 2015-10-29 NOTE — Telephone Encounter (Signed)
No answer on voice mail and no return calls. I will inform Dr Lily PeerFernandez that patient will not return phone calls and she stated to me that she could not pay the $400 co insurance required by Medicare. I explained that we only had a 30 day window to schedule the Reclast . Blood work was on 10/17/2015

## 2015-11-04 ENCOUNTER — Encounter: Payer: Medicare Other | Admitting: Gynecology

## 2015-12-05 ENCOUNTER — Ambulatory Visit (INDEPENDENT_AMBULATORY_CARE_PROVIDER_SITE_OTHER): Payer: Medicare Other | Admitting: Gynecology

## 2015-12-05 ENCOUNTER — Encounter: Payer: Self-pay | Admitting: Gynecology

## 2015-12-05 ENCOUNTER — Telehealth: Payer: Self-pay | Admitting: *Deleted

## 2015-12-05 VITALS — BP 130/88 | Ht 59.25 in | Wt 140.0 lb

## 2015-12-05 DIAGNOSIS — E042 Nontoxic multinodular goiter: Secondary | ICD-10-CM

## 2015-12-05 DIAGNOSIS — E041 Nontoxic single thyroid nodule: Secondary | ICD-10-CM | POA: Diagnosis not present

## 2015-12-05 DIAGNOSIS — Z01419 Encounter for gynecological examination (general) (routine) without abnormal findings: Secondary | ICD-10-CM | POA: Diagnosis not present

## 2015-12-05 DIAGNOSIS — M810 Age-related osteoporosis without current pathological fracture: Secondary | ICD-10-CM | POA: Diagnosis not present

## 2015-12-05 NOTE — Telephone Encounter (Signed)
-----   Message from Ok EdwardsJuan H Fernandez, MD sent at 12/05/2015  9:28 AM EDT ----- Please schedule appointment with Dr. Lafe GarinGherge endocrinologist. Patient with two thyroid nodules

## 2015-12-05 NOTE — Patient Instructions (Signed)
Dieta restringida en grasas y colesterol (Fat and Cholesterol Restricted Diet) El exceso de grasas y colesterol en la dieta puede causar problemas de salud. Esta dieta lo ayudar a mantener las grasas y el colesterol en los niveles normales para evitar enfermarse. QU TIPOS DE GRASAS DEBO ELEGIR?  Elija grasas monosaturadas y polinsaturadas. Estas se encuentran en alimentos como el aceite de oliva, aceite de canola, semillas de lino, nueces, almendras y semillas.  Consuma ms grasas omega-3. Las mejores opciones incluyen salmn, caballa, sardinas, atn, aceite de lino y semillas de lino molidas.  Limite el consumo de grasas saturadas, que se encuentran en productos de origen animal, como carnes, mantequilla y crema. Tambin pueden estar en productos vegetales, como aceite de palma, de palmiste y de coco.   Evite los alimentos con aceites parcialmente hidrogenados. Estos contienen grasas trans. Entre los ejemplos de alimentos con grasas trans se incluyen margarinas en barra, algunas margarinas untables, galletas dulces o saladas y otros productos horneados. QU PAUTAS GENERALES DEBO SEGUIR?   Lea las etiquetas de los alimentos. Busque las palabras "grasas trans" y "grasas saturadas".  Al preparar una comida:  Llene la mitad del plato con verduras y ensaladas de hojas verdes.  Llene un cuarto del plato con cereales integrales. Busque la palabra "integral" en el primer lugar de la lista de ingredientes.  Llene un cuarto del plato con alimentos con protenas magras.  Limite las frutas a dos porciones por da. Elija frutas en lugar de jugos.  Coma ms alimentos con fibra soluble, por ejemplo, manzanas, brcoli, zanahorias, frijoles, guisantes y cebada. Trate de consumir de 20a 30g (gramos) de fibra por da.  Coma ms comidas caseras. Coma menos en los restaurantes y los bares.  Limite o evite el alcohol.  Limite los alimentos con alto contenido de almidn y azcar.  Limite el consumo  de alimentos fritos.  Cocine los alimentos sin frerlos. Las opciones de coccin ms adecuadas son hornear, hervir, grillar y asar a la parrilla.  Baje de peso si es necesario. Aunque pierda poco peso, esto puede ser importante para la salud general. Tambin puede ayudar a prevenir enfermedades como diabetes y enfermedad cardaca. QU ALIMENTOS PUEDO COMER? Cereales Cereales integrales, como los panes de salvado o integrales, las galletas, los cereales y las pastas. Avena sin endulzar, trigo, cebada, quinua o arroz integral. Tortillas de harina de maz o de salvado. Verduras Verduras frescas o congeladas (crudas, al vapor, asadas o grilladas). Ensaladas de hojas verdes. Frutas Frutas frescas, en conserva (en su jugo natural) o frutas congeladas. Carnes y otros productos con protenas Carne de res molida (al 85% o ms magra), carne de res de animales alimentados con pastos o carne de res sin la grasa. Pollo o pavo sin piel. Carne de pollo o de pavo molida. Cerdo sin la grasa. Todos los pescados y frutos de mar. Huevos. Porotos, guisantes o lentejas secos. Frutos secos o semillas sin sal. Frijoles secos o en lata sin sal. Lcteos Productos lcteos con bajo contenido de grasas, como leche descremada o al 1%, quesos reducidos en grasas o al 2%, ricota con bajo contenido de grasas o queso cottage, o yogur natural con bajo contenido de grasas. Grasas y aceites Margarinas untables que no contengan grasas trans. [place] y condimentos para ensaladas livianos o reducidos en grasas. [place]. Aceites de oliva, canola, ssamo o crtamo. Mantequilla natural de cacahuate o almendra (elija la que no tenga agregado de aceite o azcar). Los artculos mencionados arriba pueden no ser una lista completa   de las bebidas o los alimentos recomendados. Comunquese con el nutricionista para conocer ms opciones. QU ALIMENTOS NO SE RECOMIENDAN? Cereales Pan blanco. Pastas blancas. Arroz blanco. Pan de maz.  Bagels, pasteles y croissants. Galletas saladas que contengan grasas trans. Verduras Papas blancas. MazHoover Brunette. Verduras con crema o fritas. Verduras en salsa de [place]o. Nils PyleFrutas Frutas secas. Fruta enlatada en almbar liviano o espeso. Jugo de frutas. Carnes y otros productos con protenas Cortes de carne con Holiday representativegrasa. Costillas, alas de pollo, tocineta, salchicha, mortadela, salame, chinchulines, tocino, perros calientes, salchichas alemanas y embutidos envasados. Hgado y otros rganos. Lcteos Leche entera o al 2%, crema, mezcla de Aleknagikleche y crema, y queso crema. Quesos enteros. Yogur entero o endulzado. Quesos con toda su grasa. Cremas no lcteas y coberturas batidas. Quesos procesados, quesos para untar o cuajadas. Dulces y postres Jarabe de maz, azcares, miel y Radio broadcast assistantmelazas. Caramelos. Mermelada y Kazakhstanjalea. Doreen BeamJarabe. Cereales endulzados. Galletas, pasteles, bizcochuelos, donas, muffins y helado. Grasas y [address] [place], Indiamargarina en barra, [place] de Deer Canyoncerdo, Athensgrasa, Singaporemantequilla clarificada o grasa de tocino. Aceites de coco, de palmiste o de palma. Bebidas Alcohol. Bebidas endulzadas (como refrescos, limonadas y bebidas frutales o ponches). Los artculos mencionados arriba pueden no ser Raytheonuna lista completa de las bebidas y los alimentos que se Theatre stage managerdeben evitar. Comunquese con el nutricionista para obtener ms informacin.   Esta informacin no tiene Theme park managercomo fin reemplazar el consejo del mdico. Asegrese de hacerle al mdico cualquier pregunta que tenga.   Document Released: 06/15/2005 Document Revised: 07/06/2014 Elsevier Interactive Patient Education 2016 [employer]vier [employer]. Dieta libre de gluten para personas con enfermedad celaca (Gluten-Free Diet for Celiac Disease) El gluten es una protena que se encuentra en el trigo, el centeno, la cebada y triticale (una Glassboromezcla de trigo y centeno). Las personas con enfermedad celaca deben seguir una dieta libre de gluten. Al tener enfermedad celaca, el gluten interfiere  con la absorcin de los alimentos y puede ocasionar dao intestinal.  Es necesario cumplirla estrictamente, aun en los perodos en los que no se presentan sntomas. Esto significa eliminar todos los alimentos con gluten de su dieta de MetLifemanera permanente. Esto requiere Southwest Airlinesalgunos importantes cambios, pero es muy controlable. QU NECESITO SABER ACERCA DE UNA DIETA LIBRE DE GLUTEN?  Busque artculos que en la etiqueta incluyan la inscripcin "GF" ("sin gluten" o "libre de gluten"). Si busca la inscripcin GF le ser ms fcil identificar los productos que es seguro consumir.  Lea todas las etiquetas. El gluten puede aadirse como un ingrediente menor en los productos ms inesperados, como quesos rallados o helados. Siempre controle todas las etiquetas de los alimentos e investigue los ingredientes cuestionables. Hable con el mdico o el nutricionista si tiene preguntas acerca de ciertos alimentos o necesita ayuda para identificar los alimentos libres de gluten.  Verifique cuando tenga dudas. Si usted no est seguro acerca del contenido de gluten de un ingrediente, verifquelo con el fabricante. Tenga en cuenta tambin que algunos fabricantes pueden cambiar los ingredientes sin previo aviso. Lea siempre las etiquetas.  Sepa cmo est preparado el alimento. Como la harina y los productos que contienen cereales son usados con frecuencia en la preparacin de alimentos, es importante estar prevenidos acerca de los mtodos de preparacin utilizados, as como de los ingredientes en los alimentos mismos. Esto es especialmente importante cuando deba comer afuera. Pregunte en restaurantes si cuentan con un men libre de gluten.  Est atento a la contaminacin cruzada. La contaminacin cruzada ocurre cuando los alimentos libres de gluten estn en contacto con  alimentos que contienen gluten. Con frecuencia ocurre durante el proceso de fabricacin. Siempre controle la lista de ingredientes y las advertencias en los  paquetes, como "puede contener gluten".  Consuma una dieta equilibrada. Es importante que ingiera fibras hierro y vitaminas B suficientes en su dieta. Busque productos integrales libres de gluten enriquecidos y contine comiendo una dieta bien balanceada de elementos que no sean cereales, como vegetales, frutas, protenas magras, legumbres y lcteos.  Considere tomar complejos multivitamnicos y suplementos minerales libres de gluten. Convrselo con el mdico. QU PALABRAS CLAVE AYUDAN A IDENTIFICAR EL GLUTEN? Sepa las palabras clave que ayudan a identificar el gluten. Un nutricionista puede ayudarlo a identificar posibles ingredientes dainos en los alimentos que usted consume habitualmente. Las palabras que hay que [employer] etiquetas de alimentos son:   Fayne Mediate, [place] enriquecida, [place] con bromatos, [place], [place] de trigo duro, [place] de graham, harina fosfatada, harina leudante o smola.  Almidn, dextrina, almidn comestible modificado o cereales.  Espesantes, rellenos o agentes emulsionantes.  Cualquier tipo de saborizante de [place], extracto o jarabe (la [place] est hecha de cebada e incluye vinagre de [place], leche malteada y bebidas con [place]).  Protena vegetal hidrolizada. QU ALIMENTOS PUEDO COMER? A continuacin, hay una lista de los alimentos comunes que estn permitidos en una dieta libre de gluten.  Cereales Productos hechos con los siguientes cereales o harinas:amaranto,harinas de frijol, harina 100% de trigo sarraceno, maz, mijo, comidas o harinas de frutos secos, avena libre de gluten, quinua, arroz, sorgo, tef, cualquier Goldman Sachs de [place] multipropsito 100% libre de gluten, galletas de arroz, tortillas con harina de maz, palomitas de maz, algunas galletas, algunas papas fritas y cereales de maz calientes. Consulte a su nutricionista cules son los cereales permitidos para comer fros o calientes. Maz molido, arroz o arroz salvaje y pasta especial libre  de gluten. Algunos fideos asiticos de arroz o judas. Almidn de arrurruz, salvado de maz, harina de maz, germen de maz, harina de maz, almidn de maz, [place] de papa, [place] de almidn de papa y salvado de Surveyor, minerals. Harinas de arroz: comn, marrn y [place]. Arroz pulido, harina de soja, almidn de tapioca. Vegetales [employer] frescos, congelados o enlatados.  Frutas Todas las frutas frescas, [place], en conserva, frutas secas y jugos de frutas.  Carnes y otros alimentos con protenas Carne, pescado, aves de corral o huevos preparados sin trigo, centeno, [place] o triticale aadidos. Algunos embutidos y Primary school teacher de [place]. Carnes solas. [employer] quesos [place], la mayora de los productos de queso procesados, algunos quesos cottage y algunos quesos crema. Porotos y guisantes secos; lentejas.  Lcteos Leche y yogur preparado con ingredientes permitidos.  Bebidas Caf (comn o descafeinado), t, t de hierbas (lea la etiqueta para comprobar que no le han agregado [place]). Bebidas gaseosas y [place] cervezas de Proofreader. Vino, sake, bebidas espirituosas destiladas como gin, vodka y whisky. Cervezas y sidras libres de gluten.  Dulcesy postres Azcar, miel, algunos [place], [place], [place], [place], caramelos duros, [address], pastillas de goma, caramelos caseros que no contengan trigo, [place], [place] o triticale. Coco. Tommy [place], algunas mezclas para budines, budines caseros que contengan almidn de maz, arroz y tapioca. Postres de Network engineer, sorbetes y helados de [place]. Tortas, masitas y otros postres preparados con las harinas permitidas. Algunos helados comerciales. Consulte a su nutricionista sobre las marcas especficas de postres permitidos.  Grasas y [address], [place], aceite vegetal, crema agria sin almidn comestible modificado, crema batida, grasa, [place] de cerdo, crema y [employer] Algunos aderezos  comerciales para ensaladas. Mantequilla de man.   Otros Caldos y sopas caseros preparados con ingredientes permitidos; algunas sopas enlatadas o congeladas. Otros alimentos combinados o preparados que no contengan gluten. El glutamato monosdico (MSG). Sidra, vinagre de arroz y de vino. Bicarbonato de sodio y polvo de Development worker, community. Algunas salsas de soja (Tamari). Consulte a su nutricionista sobre las marcas especficas permitidas. Nueces, coco, chocolate y polvo de cacao puro. Sal, pimienta, hierbas, especias, extractos y colorantes alimentarios. Esta no es Raytheon de los alimentos o las bebidas permitidos. Comunquese con el nutricionista para conocer ms opciones. QU ALIMENTOS NO PUEDO COMER? A continuacin, hay una lista de los alimentos comunes que no estn permitidos en una dieta libre de gluten.  Cereales Cebada, salvado, burgol, trigo partido, graham, [place], pan cimo, germen de trigo y [employer] Inc cereales de trigo y centeno, incluidos espelta y [place]. Evite los cereales que contengan [place] como saborizante, como el cereal de arroz. Tambin evite los fideos comunes, espaguetis, Engineer, production; la mayor parte de las mezclas de arroz [place], y las que contengan trigo, [place], [place] o triticale.  Vegetales La Harley-Davidson de los vegetales con crema, la mayora de los vegetales en conserva con salsa y cualquier vegetal preparado con trigo, [place], [place] o triticale.  Frutas Confituras o mermeladas de fruta, y algunos rellenos para tartas.  Carnes y [address] fuentes de protenas [place] carne o sustituto que [place]pie trigo, [place], [place], o estabilizantes con gluten (como algunos perros calientes, salame, fiambres o salchichas). Productos que contengan pan, como filete suizo, croquetas, y Arboriculturist de carne. La mayora de los atunes enlatados en caldo de vegetales, pavo con protenas de vegetales hidrolizadas inyectadas como parte del Print production planner, y cualquier queso que contenga goma de avena. Seitn. Pescado de imitacin. Lcteos Leche [employer], que puede contener cereal agregado y [place]. Bebidas Ciertas bebidas de cereal. Cerveza y sidras (excepto que sea [place] de gluten), ale, [place], algunas cervezas de Proofreader. Dulcesy postres Caramelos comerciales que contengan trigo, [place], [place] o triticale. Algunos caramelos estn espolvoreados con harina de trigo. Nueces baadas en chocolate, porque muchas veces estn rebozadas en harina. Tortas, galletas, rosquillas y pasteles preparados con [place] de trigo, [place], centeno o triticale. Algunos helados comerciales, sabores de helados que contengan galletitas, migas o tarta de [place]. Conos de helado. Las [employer] comerciales para preparar tortas, Gaffer y otros postres a menos que tenga una indicacin de [place] de gluten. Budn de pan y otros budines espesados con [place]. Grasas y aceites Algunos condimentos para ensaladas y cremas agrias comerciales que contengan almidn comestible modificado.  Condimentos Algunos tipos de curry en polvo, algunas mezclas para condimento desecadas, algunos extractos de carne, algunas salsas de carne, algunos tipos de ktchup, algunos tipos de mostaza, rbanos picantes. Otros Todas las sopas que [place] de [place], [place], [place] o triticale. Caldos y caldos en cubos que contengan protenas vegetales hidrolizadas. Alimentos combinados o preparados que contengan gluten. Algunas salsas de soja, algunas salsas para untar, y algunas gomas de Theatre manager. Extracto de Midwife (contiene cebada) Colorante caramelo (puede contener [place]). Los artculos mencionados arriba pueden no ser Raytheon de las bebidas y los alimentos que se Theatre stage manager. Comunquese con el nutricionista para recibir ms informacin.   Esta informacin no tiene Theme park manager el consejo del mdico. Asegrese de hacerle al mdico cualquier pregunta que tenga.   Document Released: 03/25/2005 Document Revised: 06/20/2013 Elsevier Interactive Patient Education  2016 [employer]. Ndulos tiroideos (Thyroid Nodule) Un ndulo tiroideo es un  crecimiento aislado de clulas tiroideas que forman un bulto en la glndula tiroidea. La glndula tiroidea tiene forma de [place] y est [place] en la parte delantera inferior del cuello. Esta glndula enva mensajeros qumicos (hormonas) a travs de la sangre a todo el organismo. Estas hormonas son importantes en la regulacin de la temperatura corporal y ayudan al cuerpo a Software engineer. Los ndulos tiroideos son frecuentes. Katha Hamming no son cancerosos (son benignos). Una persona puede tener uno o varios ndulos.  Hay diferentes tipos de ndulos tiroideos, entre ellos:  Ndulos que crecen y se llenan de lquido (quistes tiroideos).  Ndulos que producen una cantidad excesiva de hormona tiroidea (ndulos calientes o hipertiroideos).  Ndulos que no producen hormona tiroidea (ndulos fros o hipotiroideos).  Ndulos que se forman a partir de Scientist, clinical (histocompatibility and immunogenetics) (cncer de tiroides). CAUSAS Por lo general, se desconoce la causa de esta afeccin. FACTORES DE RIESGO Los factores que aumentan la probabilidad de que esta afeccin se desarrolle incluyen lo siguiente:  La edad. Los ndulos tiroideos son ms frecuentes en las [employer] de [place].  El sexo.  Los ndulos tiroideos benignos son ms frecuentes en las mujeres,  y los cancerosos (malignos), en los hombres.  Antecedentes familiares que abarcan lo siguiente:  Ndulos tiroideos.  Feocromocitoma.  Carcinoma de tiroides.  Hiperparatiroidismo.  Determinados tipos de enfermedades tiroideas, como tiroiditis de [place].  Carencia de yodo.  Antecedentes de radiacin en la cabeza y el cuello, por ejemplo, rayosX. SNTOMAS Es frecuente que esta afeccin no cause sntomas. Si tiene sntomas, estos pueden Johnson & Johnson siguientes:  Un bulto en la parte inferior del cuello.  Sensacin de tener un bulto o un cosquilleo en la garganta.  Dolor  en el cuello, la mandbula o el odo.  Dificultad para tragar. Los ndulos calientes pueden causar algunos de los siguientes sntomas:  Prdida de [place].  Piel enrojecida y caliente.  Sensacin de calor.  Nerviosismo.  Frecuencia cardaca acelerada. Los ndulos fros pueden causar algunos de los siguientes sntomas:  Aumento de [place].  Piel seca.  Debilitamiento del cabello que tambin puede presentarse con la cada del cabello.  Sensacin de fro.  Fatiga. Los ndulos tiroideos cancerosos pueden causar BorgWarner de los siguientes sntomas:  Ndulos duros que parecen estar adheridos a la glndula tiroidea.  Ronquera.  Bultos en los ganglios cercanos a la tiroides (ganglios linfticos). DIAGNSTICO El mdico puede palpar un ndulo tiroideo durante un examen fsico. Esta afeccin tambin se puede diagnosticar en funcin de los sntomas. Tambin pueden hacerle exmenes que incluyen los siguientes:  Hydrologist. Se puede realizar para Optometrist.  Biopsia. Se toma una muestra del ndulo y se la examina con un microscopio para determinar si el ndulo es benigno.  Anlisis de sangre para saber si la tiroides funciona correctamente.  Se pueden realizar estudios de diagnstico por imgenes, como una resonancia magntica (RM) o una tomografa computarizada (TC) en los siguientes casos:  El ndulo tiroideo es grande.  El ndulo tiroideo est obstruyendo las vas respiratorias.  Se sospecha la presencia de cncer. TRATAMIENTO El tratamiento depende de la causa y del tamao del o de los ndulos. Si el ndulo es benigno, tal vez no se necesite tratamiento. El mdico puede controlar el ndulo para saber si desaparece sin tratamiento. Si el ndulo sigue creciendo, es canceroso o no desaparece:  Tal vez haya que drenarlo con [place].  Es posible que haya que extirparlo con [place]. Si se somete a [place], puede que [place]  que extirpar [employer] Products and Chemicals parte o la  totalidad de la glndula tiroides. INSTRUCCIONES PARA EL CUIDADO EN EL HOGAR  Est atento a cualquier cambio en el ndulo.  Tome los medicamentos de venta libre y los recetados solamente como se lo haya indicado el mdico.  Oceanographer a todas las visitas de control como se lo haya indicado el mdico. Esto es importante. SOLICITE ATENCIN MDICA SI:  Le cambia la voz.  Presenta dificultad para tragar.  Siente dolor en el cuello, el odo o la mandbula que se vuelve ms intenso.  El ndulo se agranda.  El ndulo empieza a dificultarle la respiracin. SOLICITE ATENCIN MDICA DE INMEDIATO SI:  Tiene fiebre que aparece de repente.  Se siente muy dbil.  Parece que los msculos se le estn encogiendo (prdida de la masa muscular).  Tiene cambios en el estado de nimo.  Est muy inquieto.  Se siente confundido.  Ve u oye cosas que otras personas no ven ni oyen (tiene alucinaciones).  Tiene nuseas que aparecen de repente o vomita.  Tiene diarrea sbitamente.  Siente dolor en el pecho.  Pierde la conciencia.   Esta informacin no tiene Theme park manager el consejo del mdico. Asegrese de hacerle al mdico cualquier pregunta que tenga.   Document Released: 06/01/2012 Document Revised: 03/06/2015 Elsevier Interactive Patient Education [employer]

## 2015-12-05 NOTE — Telephone Encounter (Signed)
Referral placed they will contact pt to schedule. 

## 2015-12-05 NOTE — Progress Notes (Signed)
Ashley Estrada [birth year] [id number]   History:    70 y.o.  for annual gyn exam who has a history of osteoporosis but has also issues with compliance on medication and also insurance coverage Chin out-of-pocket expenses. Her PCP has been treating her for hyperlipidemia and has done her recent blood work. She also brought with her an ultrasound that was done of her neck because during the Gen. medical exam by her PCP in June he had noted a thyroid nodule the ultrasound demonstrated the following:  Right thyroid lobe there was a 1.6 x 1.5 x 1.1 cm solid nodule inferior pole left thyroid lobe there was a 3 x 1.4 x 1.5 cm complex nodule mid and inferior pole no lymphadenopathy was reported.  Review of her record indicated the following: As a result of her osteoporosis and she was originally started on Prolia in 2010 but has been a compliance issue for her to come every 6 months. Review of her record indicated that her last injection was in February 2015. She states she has been on the country for the past 4 months. [place] and a total cholesterol was normal to she stopped the Crestor. She went back and saw her primary care physician who now has her on Lipitor 10 mg daily. He is monitor her liver function tests and encourage her to maintain good compliance and continue on the medication. She states that she is taking her calcium and vitamin D daily.  Her Prolia injectio's have been as follows: November 2010 March 2012 September 2012 2013 none August 2014 May 2015  Due to patient's insurance she cannot afford Prolia so she received Reclast on 10/24/2014.  She did not return this April for her next IV infusion of Reclast and has decided to go on a drug holiday sooner than I had recommended.  Her last bone density study in 2016 when compare with 2014 demonstrated there was no statistically significant change in bone mineralization and her lowest T  score was -3.0 the AP spine. These findings indicated that her risk of a spinal fracture is 8 times greater in the general population and that she has lost 25% of her phone mass.  Her colonoscopy in January 2017 only diverticular disease no colon polyps she's on a 10 year recall. Patient is on no hormone replacement therapy. Patient would no prior history of any abnormal Pap smears.  Past medical history,surgical history, family history and social history were all reviewed and documented in the EPIC chart.  Gynecologic History No LMP recorded. Patient is postmenopausal. Contraception: post menopausal status Last Pap: 2012. Results were: normal Last mammogram: 2017. Results were: normal  Obstetric History OB History  Gravida Para Term Preterm AB SAB TAB Ectopic Multiple Living  3 3 3       3     # Outcome Date GA Lbr Len/2nd Weight Sex Delivery Anes PTL Lv  3 Term     F Vag-Spont  N Y  2 Term     M Vag-Spont  N Y  1 Term     M Vag-Spont  N Y       ROS: A ROS was performed and pertinent positives and negatives are included in the history.  GENERAL: No fevers or chills. HEENT: No change in vision, no earache, sore throat or sinus congestion. NECK: No pain or stiffness. CARDIOVASCULAR: No chest pain or pressure. No palpitations. PULMONARY: No shortness of breath,  cough or wheeze. GASTROINTESTINAL: No abdominal pain, nausea, vomiting or diarrhea, melena or bright red blood per rectum. GENITOURINARY: No urinary frequency, urgency, hesitancy or dysuria. MUSCULOSKELETAL: No joint or muscle pain, no back pain, no recent trauma. DERMATOLOGIC: No rash, no itching, no lesions. ENDOCRINE: No polyuria, polydipsia, no heat or cold intolerance. No recent change in weight. HEMATOLOGICAL: No anemia or easy bruising or bleeding. NEUROLOGIC: No headache, seizures, numbness, tingling or weakness. PSYCHIATRIC: No depression, no loss of interest in normal activity or change in sleep pattern.      Exam: chaperone present  BP 130/88 mmHg  Ht 4' 11.25" (1.505 m)  Wt 140 lb (63.504 kg)  BMI 28.04 kg/m2  Body mass index is 28.04 kg/(m^2).  General appearance : Well developed well nourished female. No acute distress HEENT: Eyes: no retinal hemorrhage or exudates,  Neck  Physical Exam  Neck:            Lungs: Clear to auscultation, no rhonchi or wheezes, or rib retractions  Heart: Regular rate and rhythm, no murmurs or gallops Breast:Examined in sitting and supine position were symmetrical in appearance, no palpable masses or tenderness,  no skin retraction, no nipple inversion, no nipple discharge, no skin discoloration, no axillary or supraclavicular lymphadenopathy Abdomen: no palpable masses or tenderness, no rebound or guarding Extremities: no edema or skin discoloration or tenderness  Pelvic:  Bartholin, Urethra, Skene Glands: Within normal limits             Vagina: No gross lesions or discharge, atrophic changes  Cervix: No gross lesions or discharge  Uterus  anteverted, normal size, shape and consistency, non-tender and mobile  Adnexa  Without masses or tenderness  Anus and perineum  normal   Rectovaginal  normal sphincter tone without palpated masses or tenderness             Hemoccult colonoscopy less than 12 months ago     Assessment/Plan:  70 y.o. female for annual exam postmenopausal on no hormone replacement therapy history of osteoporosis issues with compliance and expense from previous medications as described above. Patient will go on a drug holiday and will see what her bone density study shows next year. She was encouraged inferior calcium vitamin D on a regular basis along with weightbearing exercises 3 times a week. Pap smear not indicated. Because of her thyroid nodule she will be referred to the endocrinologist for further evaluation.   [name] MD, 9:41 AM 12/05/2015

## 2015-12-09 NOTE — Telephone Encounter (Signed)
Appointment 01/22/16 @ 10:15am

## 2015-12-13 ENCOUNTER — Telehealth: Payer: Self-pay | Admitting: Gynecology

## 2015-12-13 NOTE — Telephone Encounter (Signed)
Patient has decided that she will not receive Reclast in 2017 due to financial reasons and will be on a drug holiday per Dr Lily PeerFernandez. Reclast due 09/2015

## 2016-01-07 ENCOUNTER — Other Ambulatory Visit: Payer: Self-pay | Admitting: Endocrinology

## 2016-01-07 DIAGNOSIS — E041 Nontoxic single thyroid nodule: Secondary | ICD-10-CM

## 2016-01-09 ENCOUNTER — Other Ambulatory Visit: Payer: Self-pay | Admitting: Endocrinology

## 2016-01-09 DIAGNOSIS — E041 Nontoxic single thyroid nodule: Secondary | ICD-10-CM

## 2016-01-14 ENCOUNTER — Ambulatory Visit
Admission: RE | Admit: 2016-01-14 | Discharge: 2016-01-14 | Disposition: A | Discharge status: Home / Self Care | Payer: Medicare Other | Source: Ambulatory Visit | Attending: Endocrinology | Admitting: Endocrinology

## 2016-01-14 ENCOUNTER — Other Ambulatory Visit (HOSPITAL_COMMUNITY)
Admission: RE | Admit: 2016-01-14 | Discharge: 2016-01-14 | Disposition: A | Discharge status: Home / Self Care | Payer: Medicare Other | Source: Ambulatory Visit | Attending: Radiology | Admitting: Radiology

## 2016-01-14 DIAGNOSIS — E041 Nontoxic single thyroid nodule: Secondary | ICD-10-CM | POA: Insufficient documentation

## 2016-01-22 ENCOUNTER — Ambulatory Visit: Payer: Medicare Other | Admitting: Internal Medicine

## 2016-04-29 ENCOUNTER — Other Ambulatory Visit: Payer: Self-pay | Admitting: Endocrinology

## 2016-04-29 DIAGNOSIS — E042 Nontoxic multinodular goiter: Secondary | ICD-10-CM

## 2016-05-19 ENCOUNTER — Ambulatory Visit
Admission: RE | Admit: 2016-05-19 | Discharge: 2016-05-19 | Disposition: A | Discharge status: Home / Self Care | Payer: Medicare Other | Source: Ambulatory Visit | Attending: Endocrinology | Admitting: Endocrinology

## 2016-05-19 ENCOUNTER — Other Ambulatory Visit (HOSPITAL_COMMUNITY)
Admission: RE | Admit: 2016-05-19 | Discharge: 2016-05-19 | Disposition: A | Discharge status: Home / Self Care | Payer: Medicare Other | Source: Ambulatory Visit | Attending: Physician Assistant | Admitting: Physician Assistant

## 2016-05-19 DIAGNOSIS — E041 Nontoxic single thyroid nodule: Secondary | ICD-10-CM | POA: Diagnosis present

## 2016-05-19 DIAGNOSIS — E042 Nontoxic multinodular goiter: Secondary | ICD-10-CM

## 2016-05-19 NOTE — Procedures (Signed)
Using direct ultrasound guidance, 5 passes were made using needles into the nodules within the left and the right lobe of the thyroid.   Specimens sent for Afirma testing.  Ultrasound was used to confirm needle placements on all occasions.   Specimens were sent to Pathology for analysis.  WENDY S BLAIR PA-C 05/19/2016 3:52 PM

## 2016-06-03 ENCOUNTER — Encounter (HOSPITAL_COMMUNITY): Payer: Self-pay

## 2016-06-18 IMAGING — MG MM SCREENING BREAST TOMO BILAT
8 series · 9 of 24 positions shown · non-contrast
Comparison: Previous exam(s).

CLINICAL DATA: Screening.

EXAM:
DIGITAL SCREENING BILATERAL MAMMOGRAM WITH 3D TOMO WITH CAD

[R CC]
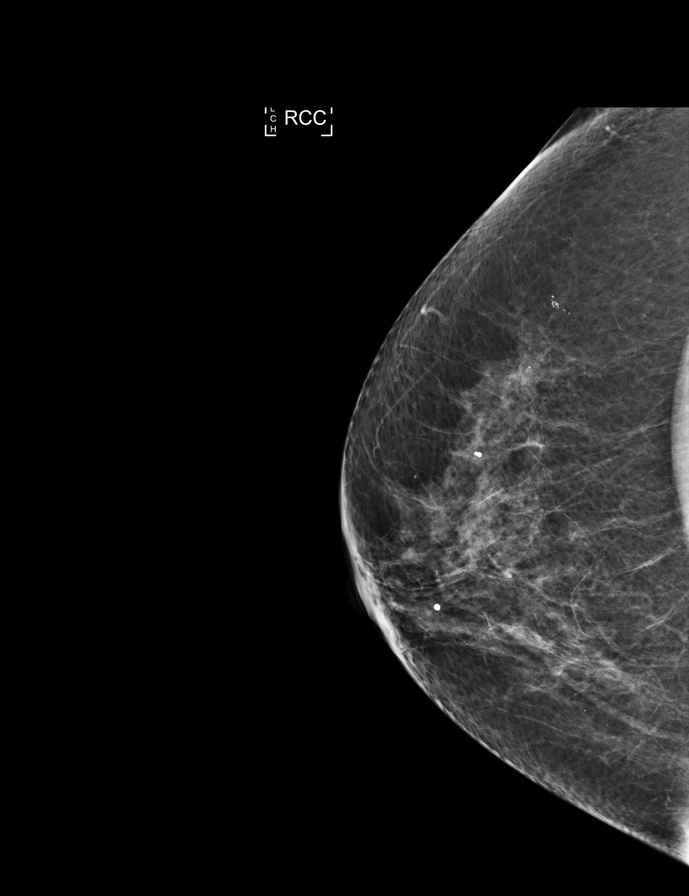

[L MLO]
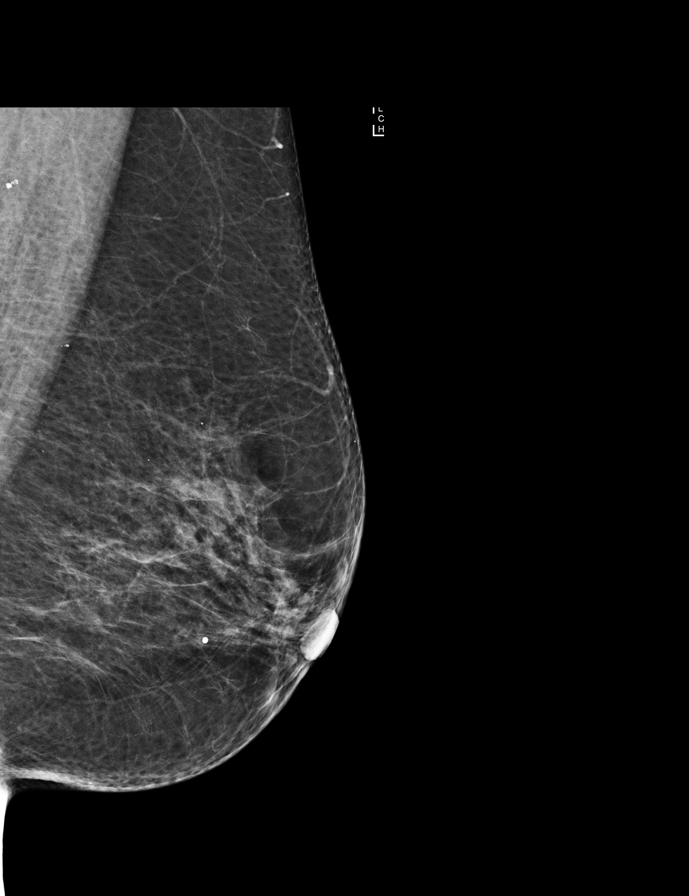

[R MLO]
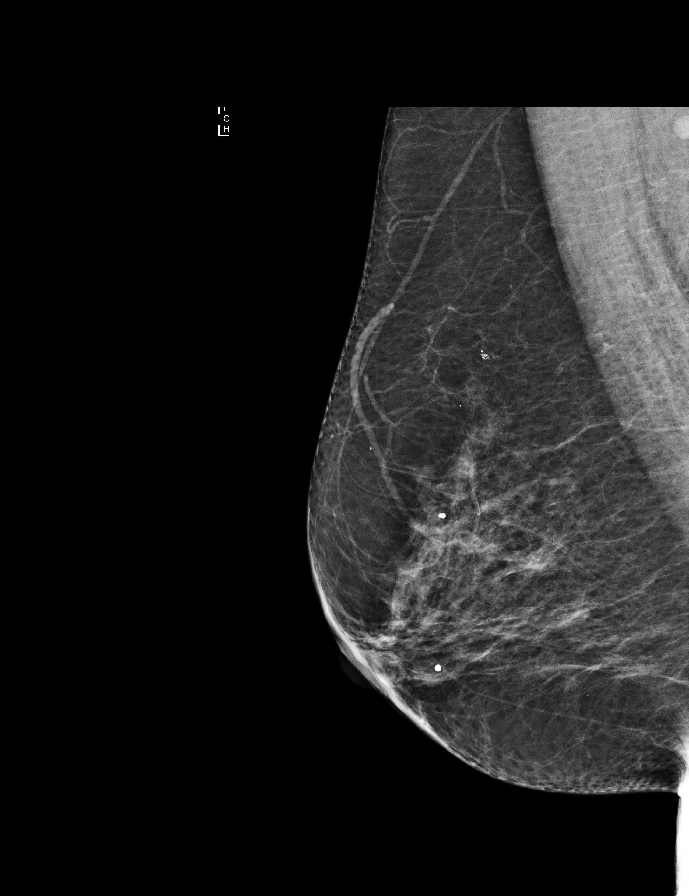

[L CC]
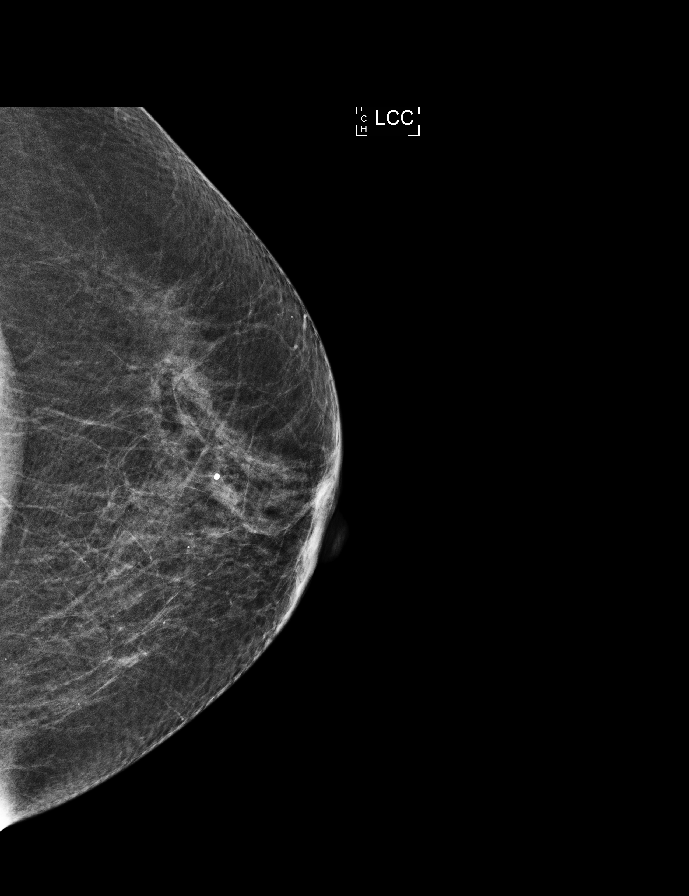

[R MLO tomo · 2 of 65 frames shown]
[frame 21/65]
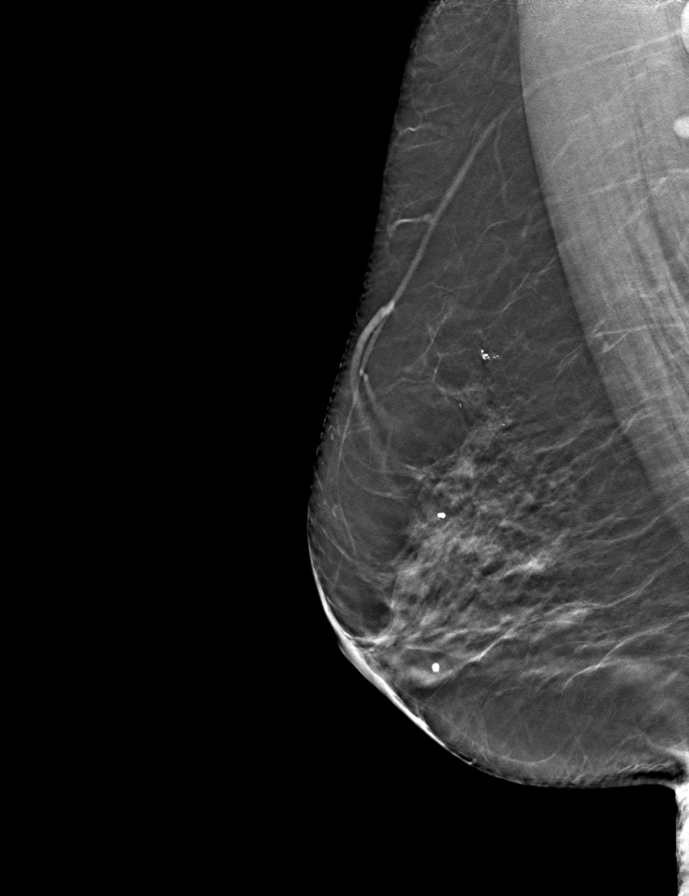
[frame 33/65]
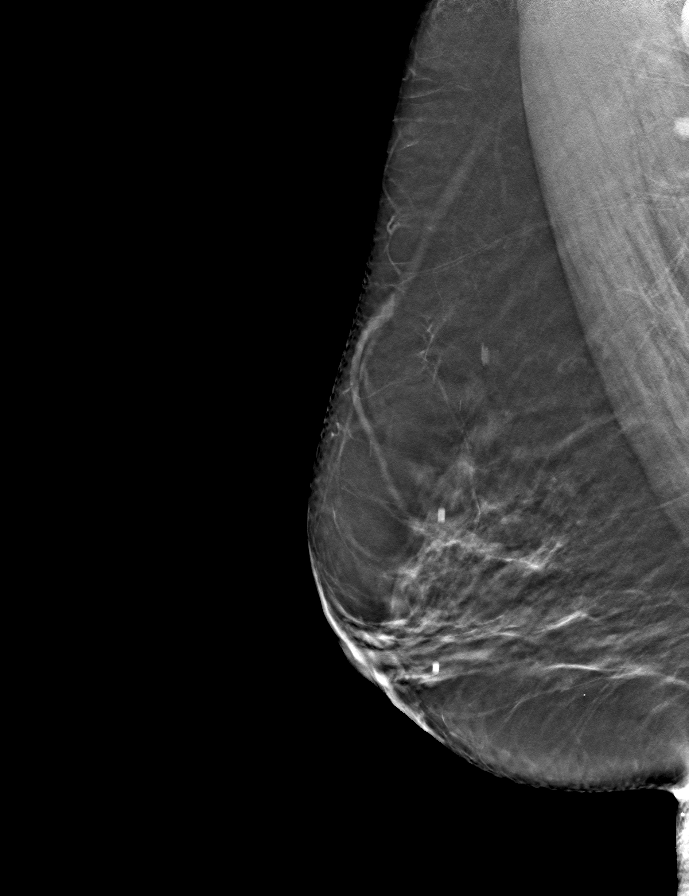

[L CC tomo · tomo slice 35/70.0]
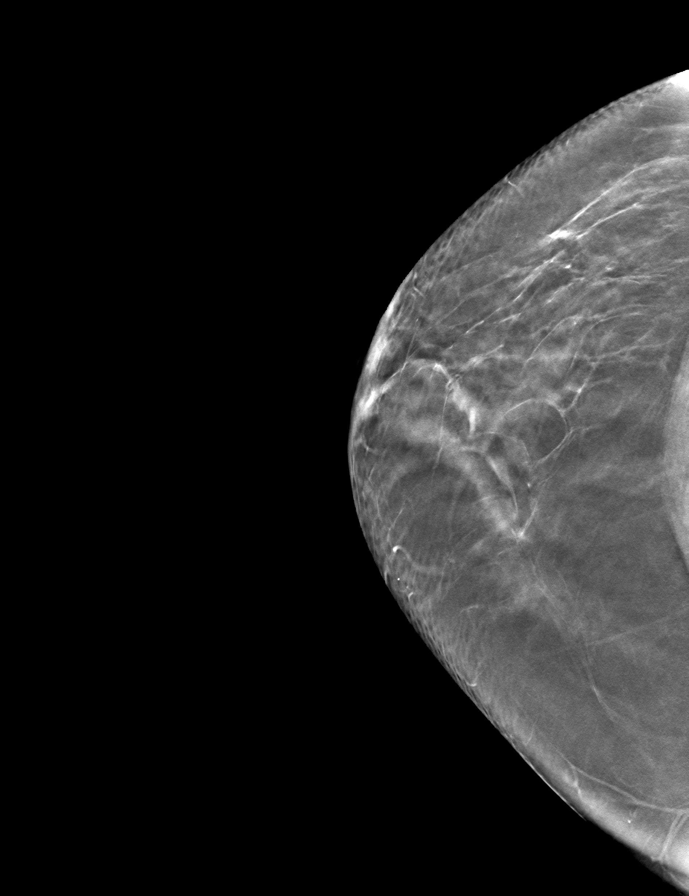

[L MLO tomo · tomo slice 33/65.0]
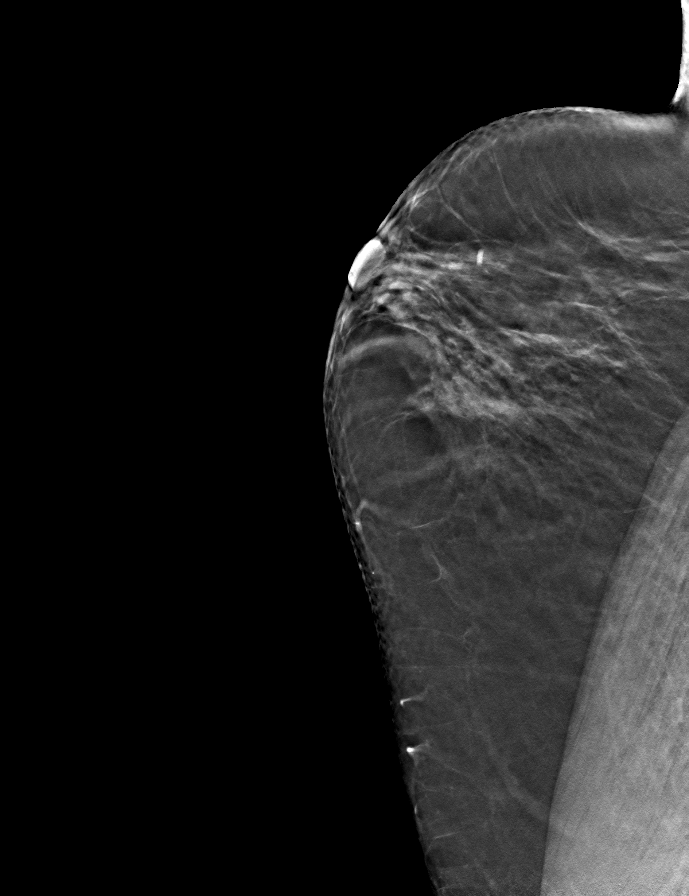

[R CC tomo · tomo slice 35/69.0]
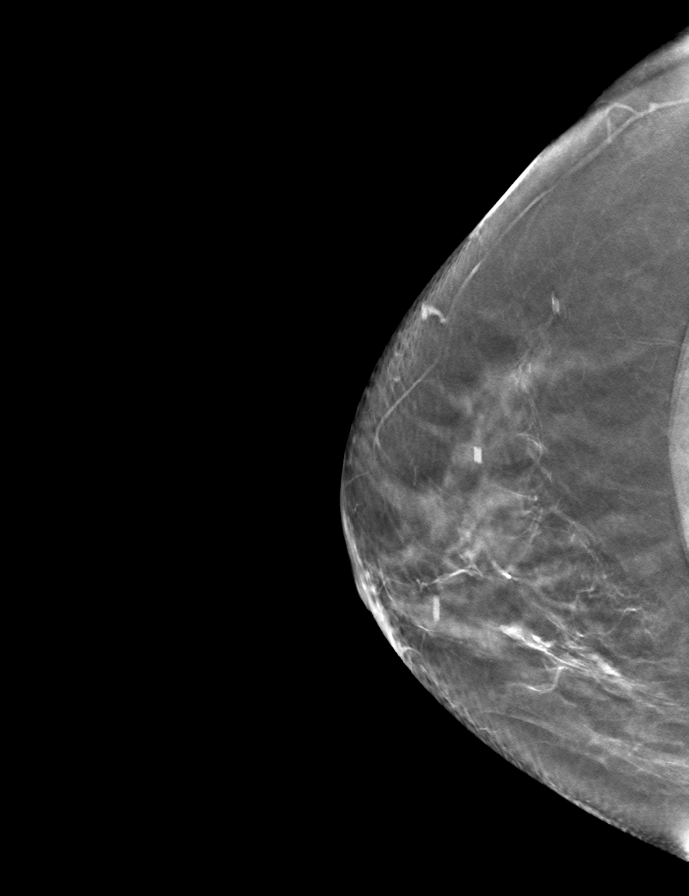

[9 of 24 positions shown; findings below may reference images not displayed]

ACR Breast Density Category b: There are scattered areas of
fibroglandular density.
FINDINGS: There are no findings suspicious for malignancy. Images were
processed with CAD.
IMPRESSION: No mammographic evidence of malignancy. A result letter of this
screening mammogram will be mailed directly to the patient.

RECOMMENDATION:
Screening mammogram in one year. (Code:55-L-23V)

BI-RADS CATEGORY  1: Negative.

## 2016-10-06 ENCOUNTER — Other Ambulatory Visit: Payer: Self-pay | Admitting: Gynecology

## 2016-10-06 DIAGNOSIS — Z1231 Encounter for screening mammogram for malignant neoplasm of breast: Secondary | ICD-10-CM

## 2016-11-11 ENCOUNTER — Encounter: Payer: Self-pay | Admitting: Gynecology

## 2016-12-02 ENCOUNTER — Ambulatory Visit: Payer: Medicare Other

## 2016-12-10 ENCOUNTER — Ambulatory Visit (INDEPENDENT_AMBULATORY_CARE_PROVIDER_SITE_OTHER): Payer: Medicare Other | Admitting: Gynecology

## 2016-12-10 ENCOUNTER — Encounter: Payer: Self-pay | Admitting: Gynecology

## 2016-12-10 VITALS — BP 126/80 | Ht 59.0 in | Wt 139.0 lb

## 2016-12-10 DIAGNOSIS — M810 Age-related osteoporosis without current pathological fracture: Secondary | ICD-10-CM

## 2016-12-10 DIAGNOSIS — Z01419 Encounter for gynecological examination (general) (routine) without abnormal findings: Secondary | ICD-10-CM

## 2016-12-10 DIAGNOSIS — E559 Vitamin D deficiency, unspecified: Secondary | ICD-10-CM | POA: Insufficient documentation

## 2016-12-10 NOTE — Progress Notes (Signed)
Ashley Estrada 1946-03-29 [id number]   History:    71 y.o.  for annual gyn exam with no complaints today. Her PCP Dr. Luiz Iron has bleeding doing her blood work. Patient with past history of osteoporosis as follows:  As a result of her osteoporosis and she was originally started on Prolia in 2010 but has been a compliance issue for her to come every 6 months. Review of her record indicated that her last injection was in February 2015. She states she had been out of the country for 4 months which interrupted her injections. Her Prolia injectio's have been as follows: November 2010 March 2012 September 2012 2013 none August 2014 May 2015  Due to patient's insurance she cannot afford Prolia so she received Reclast on 10/24/2014.Her last bone density study in 2016 when compare with 2014 demonstrated there was no statistically significant change in bone mineralization and her lowest T score was -3.0 the AP spine. These findings indicated that her risk of a spinal fracture is 8 times greater in the general population and that she has lost 25% of her bone mass..  Her colonoscopy in January 2017 only diverticular disease no colon polyps she's on a 10 year recall. Patient is on no hormone replacement therapy. Patient with no past history of abnormal Pap smears.  She did not return in April 2017 for her next IV infusion of Reclast.  Patient does have past history vitamin D deficiency.  Patient did have benign thyroid nodule removed this year. She is currently on Synthroid.  Past medical history,surgical history, family history and social history were all reviewed and documented in the EPIC chart.  Gynecologic History No LMP recorded. Patient is postmenopausal. Contraception: post menopausal status Last Pap: 2012. Results were: normal Last mammogram: 2017. Results were: normal  Obstetric History OB History  Gravida Para Term Preterm AB Living  3 3 3     3   SAB TAB Ectopic Multiple  Live Births          3    # Outcome Date GA Lbr Len/2nd Weight Sex Delivery Anes PTL Lv  3 Term     F Vag-Spont  N LIV  2 Term     M Vag-Spont  N LIV  1 Term     M Vag-Spont  N LIV       ROS: A ROS was performed and pertinent positives and negatives are included in the history.  GENERAL: No fevers or chills. HEENT: No change in vision, no earache, sore throat or sinus congestion. NECK: No pain or stiffness. CARDIOVASCULAR: No chest pain or pressure. No palpitations. PULMONARY: No shortness of breath, cough or wheeze. GASTROINTESTINAL: No abdominal pain, nausea, vomiting or diarrhea, melena or bright red blood per rectum. GENITOURINARY: No urinary frequency, urgency, hesitancy or dysuria. MUSCULOSKELETAL: No joint or muscle pain, no back pain, no recent trauma. DERMATOLOGIC: No rash, no itching, no lesions. ENDOCRINE: No polyuria, polydipsia, no heat or cold intolerance. No recent change in weight. HEMATOLOGICAL: No anemia or easy bruising or bleeding. NEUROLOGIC: No headache, seizures, numbness, tingling or weakness. PSYCHIATRIC: No depression, no loss of interest in normal activity or change in sleep pattern.     Exam: chaperone present  BP 126/80   Ht 4\' 11"  (1.499 m)   Wt 139 lb (63 kg)   BMI 28.07 kg/m   Body mass index is 28.07 kg/m.  General appearance : Well developed well nourished female. No acute distress HEENT: Eyes: no retinal hemorrhage or  exudates,  Neck supple, trachea midline, no carotid bruits, no thyroidmegaly Lungs: Clear to auscultation, no rhonchi or wheezes, or rib retractions  Heart: Regular rate and rhythm, no murmurs or gallops Breast:Examined in sitting and supine position were symmetrical in appearance, no palpable masses or tenderness,  no skin retraction, no nipple inversion, no nipple discharge, no skin discoloration, no axillary or supraclavicular lymphadenopathy Abdomen: no palpable masses or tenderness, no rebound or guarding Extremities: no edema or  skin discoloration or tenderness  Pelvic:  Bartholin, Urethra, Skene Glands: Within normal limits             Vagina: No gross lesions or discharge, atrophic changes  Cervix: No gross lesions or discharge  Uterus  anteverted, normal size, shape and consistency, non-tender and mobile  Adnexa  Without masses or tenderness  Anus and perineum  normal   Rectovaginal  normal sphincter tone without palpated masses or tenderness             Hemoccult PCP will provide     Assessment/Plan:  71 y.o. female for annual exam postmenopausal on no hormone replacement therapy history of osteoporosis issues with compliance and expense from previous medications as described above. Patient has been on a drug holiday since 2016 and is scheduled for her bone density study in the next few weeks and will be making a determination of reinstituting medication ever osteoporosis has not improved. Pap smear not indicated this year. Because her vitamin D deficiency will check a vitamin D level today.   Ok EdwardsFERNANDEZ,JUAN H MD, 9:46 AM 12/10/2016

## 2016-12-10 NOTE — Patient Instructions (Signed)
Deficiencia de vitaminaD (Vitamin D Deficiency) La deficiencia de vitaminaD ocurre cuando el organismo no tiene suficiente vitaminaD. La vitaminaD es importante para el organismo por muchos motivos:  Ayuda al organismo a Engineer, production minerales llamados calcio y fsforo.  Desempea un papel en la salud de los [place].  Puede ayudar a prevenir [employer], como la diabetes y Curator.  Desempea un papel en la funcin muscular, incluida la actividad cardaca. Para obtener vitaminaD, puede hacer lo siguiente:  Consuma alimentos que contengan naturalmente vitaminaD.  Consuma o beba leche u otros productos lcteos con agregado de vitaminaD.  Tome un suplemento de vitaminaD o un suplemento multivitamnico que la contenga.  Expngase al sol. El organismo produce vitaminaD de forma natural cuando se expone la piel a la luz del sol. El organismo transforma la luz del sol en una forma de vitamina que puede Forbes. Si la deficiencia de vitaminaD es grave, puede causar una enfermedad que [employer]. En los adultos, se la conoce como osteomalacia. En los nios, recibe el nombre de raquitismo. CAUSAS La deficiencia de vitaminaD puede deberse a lo siguiente:  No comer suficientes alimentos que contengan vitamina D.  Exposicin insuficiente al sol.  Sufrir Actuary del sistema digestivo que le dificultan al organismo la absorcin de vitaminaD. Estas enfermedades incluyen la enfermedad de Crohn, la pancreatitis crnica y la fibrosis [place].  Someterse a una [place] se extirpa Ardelia Mems parte del estmago o del intestino delgado.  Ser obeso.  Tener enfermedad renal crnica o enfermedad heptica. FACTORES DE RIESGO Es ms probable que esta afeccin se manifieste en:  Las personas de edad [place].  Las [employer] no pasan mucho tiempo al [employer].  Las personas que viven en un centro de atencin de Building services engineer.  Las  personas que sufrieron fracturas seas.  Las personas que tienen huesos dbiles o delgados (osteoporosis).  Las personas que sufren una enfermedad o un trastorno que modifica la forma en que el organismo absorbe la vitaminaD.  Las personas de [employer].  Las personas que toman algunos medicamentos, como corticoides o determinados anticonvulsivos.  Las personas con sobrepeso u obesidad. SNTOMAS En los casos leves de deficiencia de vitaminaD, puede no haber sntomas. Si el trastorno es grave, los sntomas pueden incluir lo siguiente:  [employer].  Dolor muscular.  Caerse con frecuencia.  Huesos fracturados por Primary school teacher. DIAGNSTICO Por lo general, este trastorno se diagnostica mediante un anlisis de [place]. TRATAMIENTO El tratamiento de este trastorno puede depender de la causa. Entre las otras opciones de [place], se incluyen las siguientes:  Tomar suplementos de vitamina D.  Tomar un suplemento de calcio. El Statistician cul es la dosis ms [place] para usted. [place] los medicamentos y los suplementos solamente como se lo haya indicado el mdico.  Consuma alimentos que contengan vitaminaD, como por ejemplo: ? Productos lcteos, cereales o jugos fortificados. El trmino fortificado significa que se ha aadido vitaminaD al [employer]. Revise la etiqueta del paquete para estar seguro. ? Pescados grasos, como el salmn o la [place]. ? Huevos. ? Ostras.  No utilice camas solares.  Mantenga un peso saludable. Baje de peso, si es necesario.  Concurra a todas las visitas de control como se lo haya indicado el mdico. Esto es importante. SOLICITE ATENCIN MDICA SI:  Los sntomas no desaparecen.  Tiene malestar estomacal (nuseas) o vomita.  Defeca menos que lo habitual o  le resulta difcil defecar (estreimiento). Esta informacin no tiene Marine scientist el consejo del mdico. Asegrese de  hacerle al mdico cualquier pregunta que tenga. Document Released: 09/07/2011 Document Revised: 03/06/2015 Document Reviewed: 10/31/2014 Elsevier Interactive Patient Education  2018 [employer]. Osteoporosis (Osteoporosis) La osteoporosis es el debilitamiento y la prdida de la densidad sea. La osteoporosis hace que los huesos se vuelvan ms quebradizos, frgiles propensos a romperse (fractura). Con el tiempo, la osteoporosis puede hacer que los huesos se vuelvan tan dbiles que se fracturan con una simple cada. Los huesos ms propensos a las fracturas son los huesos [place] y la columna vertebral. CAUSAS La causa exacta se desconoce. [place] desarrollar osteoporosis. Puede correr un mayor riesgo si tiene antecedentes familiares de la enfermedad o desnutricin. Tambin puede tener un mayor riesgo si:  Es mujer.  Tiene 50 aos o ms.  Fuma.  No realiza actividad fsica.  Es [place]. SIGNOS Y SNTOMAS La fractura puede ser el primer signo de la enfermedad, en especial si se origina a causa de una cada o lesin que, por lo general, no provocara una fractura de hueso. Otros signos y sntomas incluyen lo siguiente:  Dolor en la parte inferior de la espalda y el cuello.  Postura encorvada.  Prdida de la altura. DIAGNSTICO Para realizar un diagnstico, el mdico:  Psychologist, sport and exercise.  Realizar un examen fsico.  Production manager, como: ? Ardelia Mems prueba de la densidad sea mineral. ? Una absorciometra con rayos X de doble energa. [place] tratamiento para la osteoporosis es fortalecer los [place], a fin de reducir el riesgo de fracturas. El tratamiento incluye lo siguiente:  Cambios de estilo de vida, como: ? Seguir una dieta con alto contenido de calcio. ? Hacer ejercicios de levantamiento de pesas y fortalecimiento muscular. ? Dejar de consumir  tabaco. ? Limitar el consumo de bebidas alcohlicas.  Tomar medicamentos para retrasar el proceso de prdida sea o aumentar la densidad sea.  Controlar los niveles de calcio y vitamina D. INSTRUCCIONES PARA EL CUIDADO EN EL HOGAR  Incluya calcio y vitamina D en su dieta. El calcio es importante para la salud sea, y la vitamina D ayuda al organismo a Astronomer calcio.  Realice ejercicios de levantamiento de pesas y fortalecimiento muscular como se lo haya indicado el mdico.  No consuma ningn producto que contenga tabaco, lo que incluye cigarrillos, tabaco de Higher education careers adviser o Psychologist, sport and exercise. Si necesita ayuda para dejar de fumar, consulte al mdico.  Limite el consumo de bebidas alcohlicas.  Tome los medicamentos solamente como se lo haya indicado el mdico.  Concurra a todas las visitas de control como se lo haya indicado el mdico. Esto es importante.  Tome las precauciones necesarias en su casa para reducir el riesgo de cadas, como: ? Mantener las habitaciones bien iluminadas y libres de obstculos. ? Instalar barandas de seguridad en las escaleras. ? Usar tapetes de caucho en el bao y en otros sectores que a menudo estn mojadas o son resbaladizas.  SOLICITE ATENCIN MDICA DE INMEDIATO SI: Sufre una cada o lesin. Esta informacin no tiene Marine scientist el consejo del mdico. Asegrese de hacerle al mdico cualquier pregunta que tenga. Document Released: 03/25/2005 Document Revised: 10/07/2015 Document Reviewed: 11/23/2013 Elsevier Interactive Patient Education  2017 [place] sea (Bone Densitometry) La densitometra sea es un estudio de diagnstico por imgenes en el que se Margretta Sidle una radiografa especial  que mide la cantidad de calcio y otros minerales en los huesos (densidad sea). Este estudio tambin se conoce como examen de densidad mineral sea o radioabsorciometra de doble energa (DEXA). Puede medir la densidad sea en la cadera y  la columna. Es similar a Education administrator. Tambin pueden hacerle este estudio para:  Diagnosticar una enfermedad que causa huesos dbiles o delgados (osteoporosis).  Predecir el riesgo de un hueso roto (fractura).  Determinar si el tratamiento para la osteoporosis funciona. INFORME A SU MDICO:  Cualquier alergia que tenga.  Todos los [employer], incluidos vitaminas, hierbas, gotas oftlmicas, cremas y medicamentos de venta libre.  Problemas previos que usted o los [employer]Health de su familia hayan tenido con el uso de anestsicos.  Enfermedades de la sangre que tenga.  Si tiene cirugas previas.  Enfermedades que tenga.  Probabilidad de embarazo.  Cualquier otro estudio mdico al que se haya sometido en los ltimos 14 das en el que se haya utilizado material de [place]. RIESGOS Y COMPLICACIONES En general, se trata de un procedimiento seguro. Sin embargo, pueden ocurrir [employer], entre los que se pueden incluir los siguientes:  [place] estudio lo expone a una cantidad muy pequea de radiacin.  Los riesgos de la exposicin a la radiacin pueden ser Nordstrom para los nios por Associate Professor. ANTES DEL PROCEDIMIENTO  No tome ningn suplemento de calcio durante 24 horas antes de [employer] K[employer]. Puede comer y beber como lo hace habitualmente.  Qutese todas las joyas de metal, anteojos, aparatos dentales y cualquier otro objeto metlico. PROCEDIMIENTO  Deber recostarse en una camilla. Un generador de rayos X estar ubicado debajo de usted y un dispositivo de imgenes, por encima.  Se pueden usar otros dispositivos, como cajas o abrazaderas, para posicionar el cuerpo apropiadamente para la exploracin.  Deber permanecer inmvil mientras la mquina explore lentamente su cuerpo.  Las imgenes se Web designer monitor de una computadora. DESPUS DEL PROCEDIMIENTO Es posible que necesite estudios adicionales ms adelante. Esta informacin no tiene Buyer, retail el consejo del mdico. Asegrese de hacerle al mdico cualquier pregunta que tenga. Document Released: 03/09/2012 Document Revised: 07/06/2014 Document Reviewed: 11/23/2013 Elsevier Interactive Patient Education  [employer].

## 2016-12-11 LAB — VITAMIN D 25 HYDROXY (VIT D DEFICIENCY, FRACTURES): VIT D 25 HYDROXY: 30 ng/mL (ref 30–100)

## 2016-12-22 ENCOUNTER — Ambulatory Visit
Admission: RE | Admit: 2016-12-22 | Discharge: 2016-12-22 | Disposition: A | Discharge status: Home / Self Care | Payer: Medicare Other | Source: Ambulatory Visit | Attending: Gynecology | Admitting: Gynecology

## 2016-12-22 DIAGNOSIS — Z1231 Encounter for screening mammogram for malignant neoplasm of breast: Secondary | ICD-10-CM

## 2017-01-28 ENCOUNTER — Ambulatory Visit (INDEPENDENT_AMBULATORY_CARE_PROVIDER_SITE_OTHER): Payer: Medicare Other

## 2017-01-28 ENCOUNTER — Other Ambulatory Visit: Payer: Self-pay | Admitting: Gynecology

## 2017-01-28 DIAGNOSIS — M810 Age-related osteoporosis without current pathological fracture: Secondary | ICD-10-CM | POA: Diagnosis not present

## 2017-01-28 DIAGNOSIS — E559 Vitamin D deficiency, unspecified: Secondary | ICD-10-CM

## 2017-09-28 IMAGING — US US THYROID BIOPSY
1 series · 13 of 17 positions shown · non-contrast
Comparison: US Thyroid from [facility] 12/02/15

MEDICATIONS:
8 cc 1% lidocaine

COMPLICATIONS:
None immediate.

INDICATION: Indeterminate thyroid nodules

Right lower pole nodule:  1.6 cm x 1.5 cm x 1.1 cm
Left middle to lower pole nodule:  3.0 cm x 1.4 cm x 1.5 cm
EXAM:
ULTRASOUND GUIDED THYROID FINE NEEDLE ASPIRATION x2
TECHNIQUE: Informed written consent was obtained from the patient after a
discussion of the risks, benefits and alternatives to treatment.
Questions regarding the procedure were encouraged and answered. A
timeout was performed prior to the initiation of the procedure.

[Series 1: us thyroid biopsy · 0.05mm/px · 17 acquisitions, 13 frames shown]
[im 1/17]
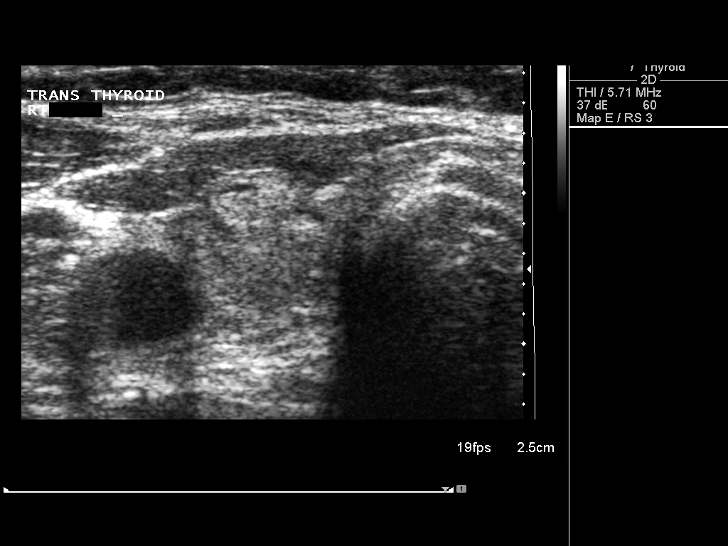
[im 2/17]
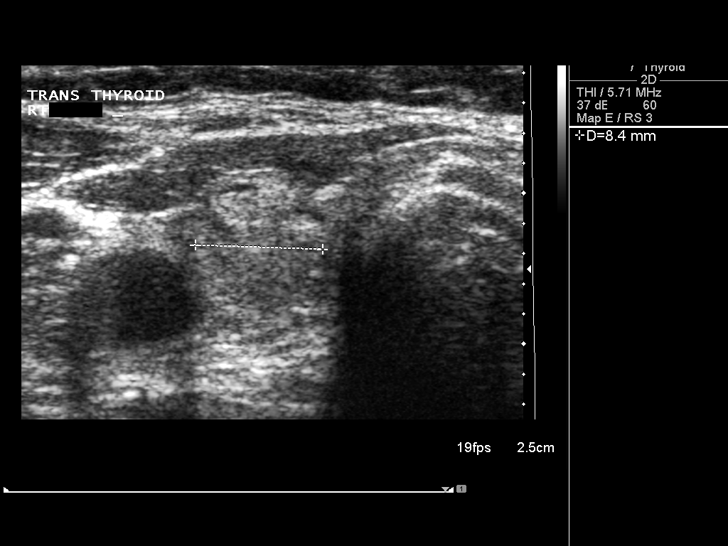
[im 4/17]
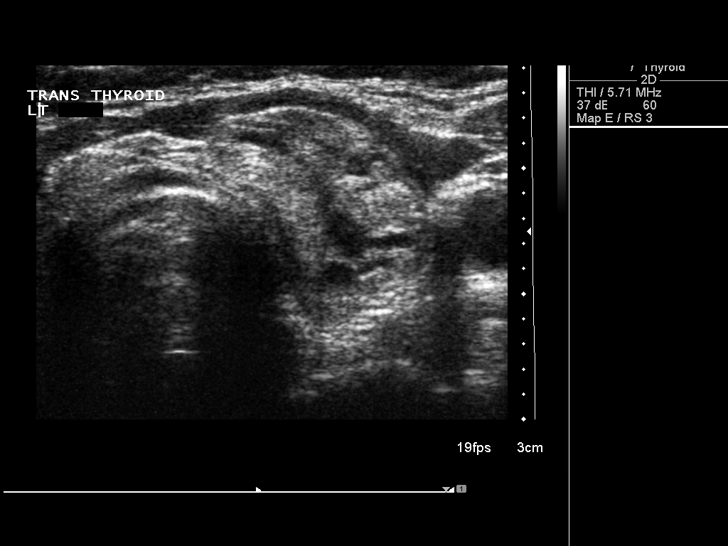
[im 5/17]
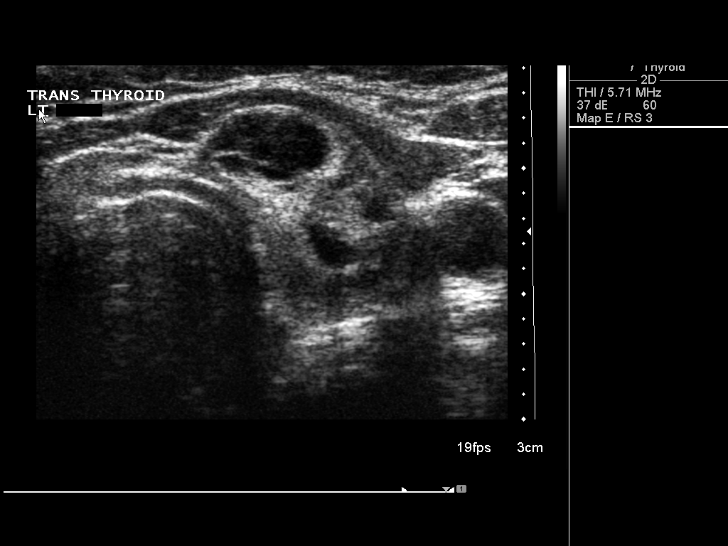
[im 6/17]
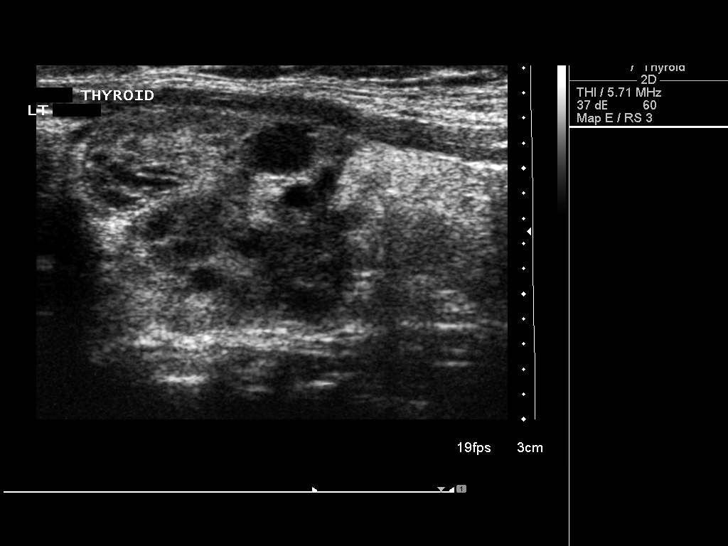
[im 8/17]
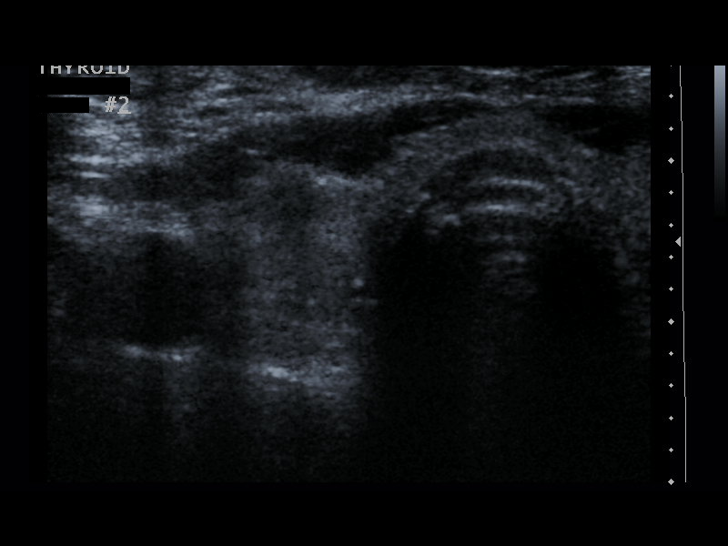
[im 9/17]
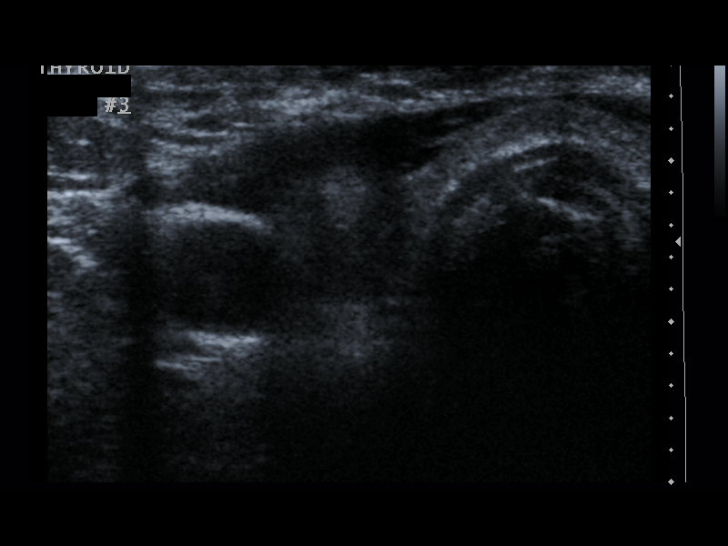
[im 10/17]
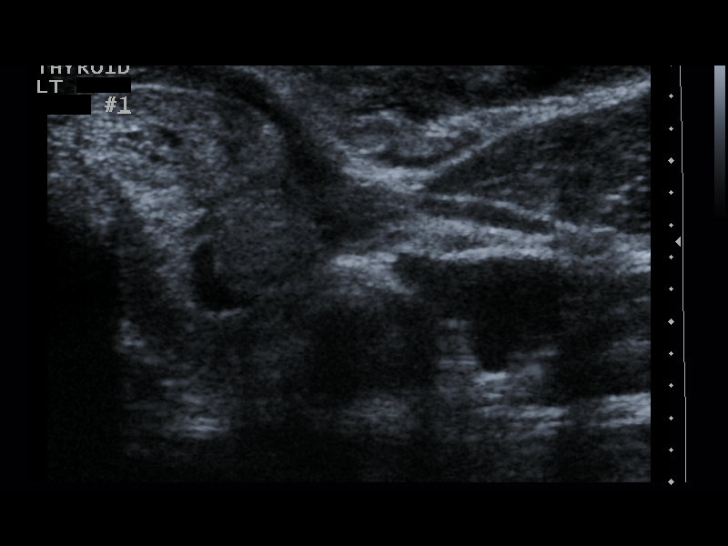
[im 12/17]
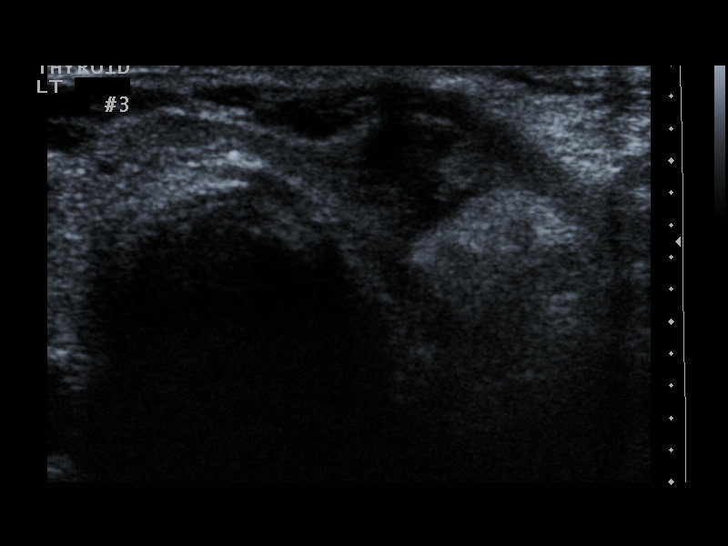
[im 13/17]
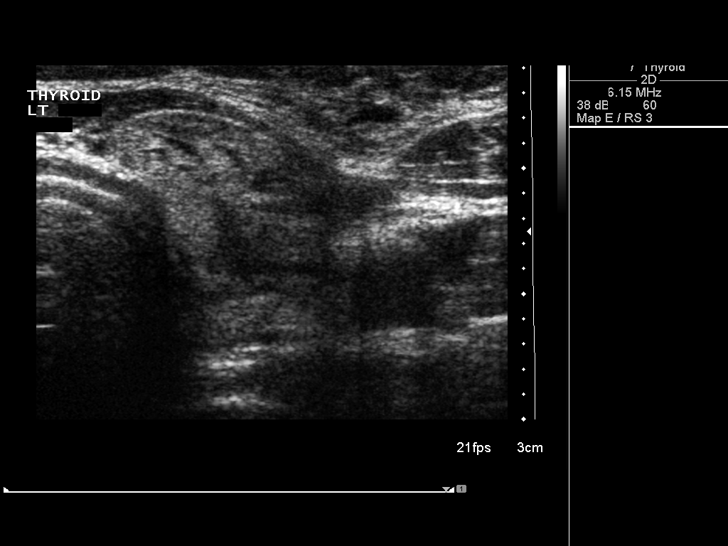
[im 14/17]
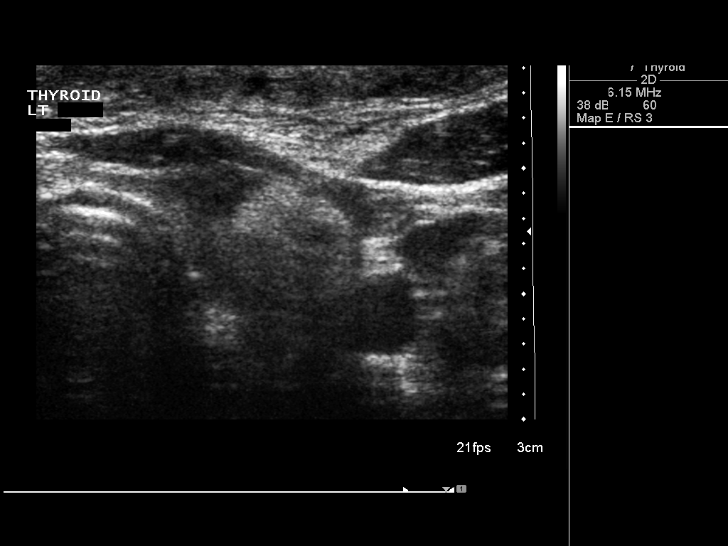
[im 16/17]
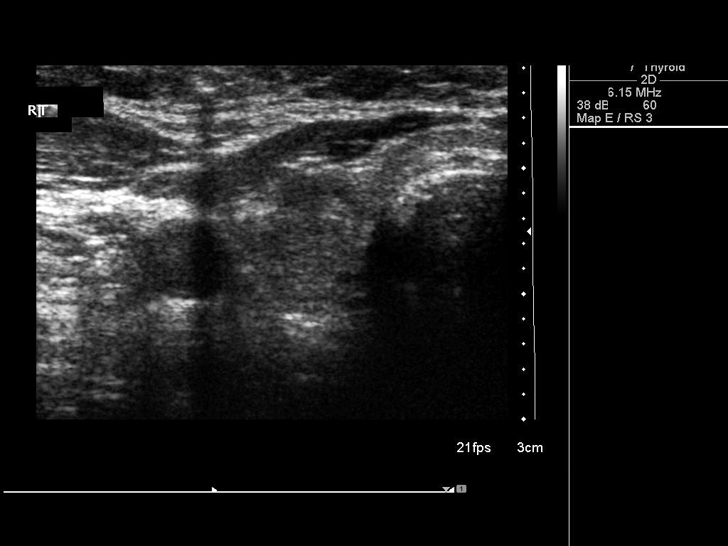
[im 17/17]
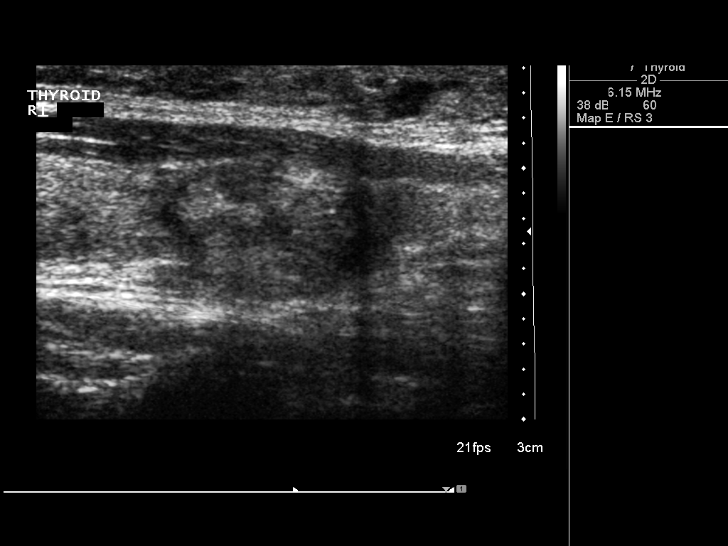

[13 of 17 positions shown; findings below may reference images not displayed]

Pre-procedural ultrasound scanning demonstrated right and left
thyroid nodules

The procedures were planned. The neck was prepped in the usual
sterile fashion, and a sterile drape was applied covering the
operative field. A timeout was performed prior to the initiation of
the procedure. Local anesthesia was provided with 1% lidocaine.

Under direct ultrasound guidance, 3 FNA biopsies were performed of
the right thyroid nodule with a 25 gauge needle. The samples were
prepared and submitted to pathology.

Under direct ultrasound guidance, 3 FNA biopsies were performed of
the left thyroid nodule with a 25 gauge needle. The samples were
prepared and submitted to pathology.

Limited post procedural scanning was negative for hematoma or
additional complication. Dressings were placed. The patient
tolerated the above procedures procedure well without immediate
postprocedural complication.
IMPRESSION: Technically successful ultrasound guided fine needle aspiration of
right and left thyroid nodules.

Read by:  Shizuko Kaoru

## 2017-12-06 ENCOUNTER — Other Ambulatory Visit: Payer: Self-pay | Admitting: Gynecology

## 2017-12-06 DIAGNOSIS — Z1231 Encounter for screening mammogram for malignant neoplasm of breast: Secondary | ICD-10-CM

## 2017-12-15 ENCOUNTER — Encounter: Payer: Medicare Other | Admitting: Obstetrics & Gynecology

## 2017-12-17 ENCOUNTER — Encounter: Payer: Self-pay | Admitting: Obstetrics & Gynecology

## 2017-12-17 ENCOUNTER — Ambulatory Visit: Payer: Medicare Other | Admitting: Obstetrics & Gynecology

## 2017-12-17 VITALS — BP 126/82 | Ht 59.25 in | Wt 137.0 lb

## 2017-12-17 DIAGNOSIS — Z01419 Encounter for gynecological examination (general) (routine) without abnormal findings: Secondary | ICD-10-CM

## 2017-12-17 DIAGNOSIS — M810 Age-related osteoporosis without current pathological fracture: Secondary | ICD-10-CM | POA: Diagnosis not present

## 2017-12-17 DIAGNOSIS — Z780 Asymptomatic menopausal state: Secondary | ICD-10-CM | POA: Diagnosis not present

## 2017-12-17 DIAGNOSIS — M25511 Pain in right shoulder: Secondary | ICD-10-CM | POA: Diagnosis not present

## 2017-12-17 NOTE — Progress Notes (Signed)
Ashley Estrada 02/17/1946 [id number]   History:    72 y.o. G3P3L3 Married.  Has grand-children  RP:  Established patient presenting for annual gyn exam   HPI: Menopause, well without HRT.  No PMB.  No pelvic pain.  Normal vaginal secretions.  Occasionally sexually active, no pain.  Urine/BMs wnl.  Breasts wnl.  Complains of pain in her right shoulder with movement, especially when raising her arm.  Remote history of right shoulder surgery.  Health Labs with Fam MD.  Treating Hypercholesterolemia with Crestor.    Past medical history,surgical history, family history and social history were all reviewed and documented in the EPIC chart.  Gynecologic History No LMP recorded. Patient is postmenopausal. Contraception: post menopausal status Last Pap: 05/2011. Results were: Negative Last mammogram: 11/2016. Results were: Negative.  Scheduled for 12/2017 Bone Density: 11/26/2017 Osteoporosis T-Score -2.6 at Spine Colonoscopy: 2017  Obstetric History OB History  Gravida Para Term Preterm AB Living  3 3 3     3   SAB TAB Ectopic Multiple Live Births          3    # Outcome Date GA Lbr Len/2nd Weight Sex Delivery Anes PTL Lv  3 Term     F Vag-Spont  N LIV  2 Term     M Vag-Spont  N LIV  1 Term     M Vag-Spont  N LIV     ROS: A ROS was performed and pertinent positives and negatives are included in the history.  GENERAL: No fevers or chills. HEENT: No change in vision, no earache, sore throat or sinus congestion. NECK: No pain or stiffness. CARDIOVASCULAR: No chest pain or pressure. No palpitations. PULMONARY: No shortness of breath, cough or wheeze. GASTROINTESTINAL: No abdominal pain, nausea, vomiting or diarrhea, melena or bright red blood per rectum. GENITOURINARY: No urinary frequency, urgency, hesitancy or dysuria. MUSCULOSKELETAL: No joint or muscle pain, no back pain, no recent trauma. DERMATOLOGIC: No rash, no itching, no lesions. ENDOCRINE: No polyuria, polydipsia, no heat or  cold intolerance. No recent change in weight. HEMATOLOGICAL: No anemia or easy bruising or bleeding. NEUROLOGIC: No headache, seizures, numbness, tingling or weakness. PSYCHIATRIC: No depression, no loss of interest in normal activity or change in sleep pattern.     Exam:   BP 126/82   Ht 4' 11.25" (1.505 m)   Wt 137 lb (62.1 kg)   BMI 27.44 kg/m   Body mass index is 27.44 kg/m.  General appearance : Well developed well nourished female. No acute distress HEENT: Eyes: no retinal hemorrhage or exudates,  Neck supple, trachea midline, no carotid bruits, no thyroidmegaly Lungs: Clear to auscultation, no rhonchi or wheezes, or rib retractions  Heart: Regular rate and rhythm, no murmurs or gallops Breast:Examined in sitting and supine position were symmetrical in appearance, no palpable masses or tenderness,  no skin retraction, no nipple inversion, no nipple discharge, no skin discoloration, no axillary or supraclavicular lymphadenopathy Abdomen: no palpable masses or tenderness, no rebound or guarding Extremities: no edema or skin discoloration or tenderness  Pelvic: Vulva: Normal             Vagina: No gross lesions or discharge  Cervix: No gross lesions or discharge  Uterus  AV, normal size, shape and consistency, non-tender and mobile  Adnexa  Without masses or tenderness  Anus: Normal   Assessment/Plan:  72 y.o. female for annual exam   1. Well female exam with routine gynecological exam Normal gynecologic exam.  Previous  Pap tests normal, last one in December 2012.  No need to do further Pap tests.  Breast exam normal.  Schedule for screening mammogram in July 2019.  Colonoscopy in 2017.  Health labs with family physician.  Treated for hypercholesterolemia.  2. Menopause present Well on no HRT, no PMB.  3. Age-related osteoporosis without current pathological fracture Osteoporosis with T-Score -2.6.  Risk of fracture discussed with patient.  Intolerance to Biphosphanates.   Restart on Prolia.  Usage, risks and benefits of Prolia reviewed.  Vitamin D supplements, calcium rich nutrition and regular weightbearing physical activity recommended.  4. Acute pain of right shoulder Refer to orthopedist.  Genia Del MD, 10:11 AM 12/17/2017

## 2017-12-19 ENCOUNTER — Encounter: Payer: Self-pay | Admitting: Obstetrics & Gynecology

## 2017-12-19 NOTE — Patient Instructions (Signed)
1. Well female exam with routine gynecological exam Normal gynecologic exam.  Previous Pap tests normal, last one in December 2012.  No need to do further Pap tests.  Breast exam normal.  Schedule for screening mammogram in July 2019.  Colonoscopy in 2017.  Health labs with family physician.  Treated for hypercholesterolemia.  2. Menopause present Well on no HRT, no PMB.  3. Age-related osteoporosis without current pathological fracture Osteoporosis with T-Score -2.6.  Risk of fracture discussed with patient.  Intolerance to Biphosphanates.  Restart on Prolia.  Usage, risks and benefits of Prolia reviewed.  Vitamin D supplements, calcium rich nutrition and regular weightbearing physical activity recommended.  4. Acute pain of right shoulder Refer to orthopedist.  Ashley Estrada, fue un placer encontrarle hoy!

## 2017-12-20 ENCOUNTER — Telehealth: Payer: Self-pay | Admitting: *Deleted

## 2017-12-20 NOTE — Telephone Encounter (Signed)
-----   Message from Genia DelMarie-Lyne Lavoie, MD sent at 12/17/2017 10:37 AM EDT ----- Regarding: Right shoulder pain with motion H/O Rt shoulder surgery a long time ago.  Recent progressive pain at Rt shoulder with movements.  Refer to Orthopedist.

## 2017-12-20 NOTE — Telephone Encounter (Signed)
I called pt and she declined the visit, states shoulder is feeling better.

## 2017-12-20 NOTE — Telephone Encounter (Signed)
Estrada, Ashley [name], Ashley Estrada, [place]        Ashley Estrada a for you   Previous Messages    ----- Message -----  From: Ashley Estrada, [name], MD  Sent: 12/19/2017 12:16 AM  To: Ashley Estrada  Subject: Osteoporosis                   Osteoporosis with T-Score -2.6 on 11/26/2017. Previously on Prolia/Reclast. Intolerance to Biphosphanates. Restart on Prolia. Ca++ levels normal with Fam MD.

## 2017-12-21 NOTE — Telephone Encounter (Signed)
Prolia insurance verification has been sent awaiting Summary of benefits  

## 2017-12-29 ENCOUNTER — Ambulatory Visit
Admission: RE | Admit: 2017-12-29 | Discharge: 2017-12-29 | Disposition: A | Discharge status: Home / Self Care | Payer: Medicare Other | Source: Ambulatory Visit | Attending: Gynecology | Admitting: Gynecology

## 2017-12-29 DIAGNOSIS — Z1231 Encounter for screening mammogram for malignant neoplasm of breast: Secondary | ICD-10-CM

## 2018-01-03 NOTE — Telephone Encounter (Addendum)
Deductible amount met  OOP MAX [id number] (150mt)  Annual exam 12/17/17 ML  Calcium   9.9        Date 11/24/17  Upcoming dental procedures   Prior Authorization needed NO  Pt estimated Cost $263.00  Left message for pt to return my call      Coverage Details: 20% OF 1065, Admin fee of $50

## 2018-01-14 NOTE — Telephone Encounter (Signed)
Spoke with patient today explained the co pay amount. She states she would like to talk to her insurance 1st. And she will call me back.  I will close encounter and wait on patients call as requested SOB scanned in system

## 2018-01-14 NOTE — Telephone Encounter (Signed)
Pt called back stating her insurance company states she is required to pay 126.46  I explained that the summary of benefits state 80/20  Pt is required 20% plus a $50 admin fee  3 way call with pt and medicare while on phone, Medicare representative states 80/20  reference # [id number]  After Medicare representative got off phone pt tells me she is going to  call back and get the spanish speaking rep on the phone that told her she is required to pay only 126.46  Waiting on call back

## 2018-01-25 NOTE — Telephone Encounter (Signed)
Spoke with pt she states,prolia is to expensive for her please recommend another medication  [facility] advice/ recommendatrions

## 2018-01-26 NOTE — Telephone Encounter (Signed)
Left message for pt to call me back 

## 2018-01-26 NOTE — Telephone Encounter (Signed)
Can start with Fosamax 70 mg every week.  Make sure she doesn't suffer from GERD.  Can set an appointment with me to discuss first if prefers.  Vit D supplements, Ca++ rich nutrition, weight bearing physical activity.  Repeat Bone Density 10/2019.

## 2018-02-01 IMAGING — US US THYROID BIOPSY
1 series · 13 of 25 positions shown · non-contrast
Comparison: Ultrasound done 12/02/2015.

COMPLICATIONS:
None immediate.

INDICATION: Bilateral thyroid nodules. Previously biopsied however both with
atypia of undetermined significance on pathology report. Request for
repeat fine needle aspirate biopsy with Afirma testing.

EXAM:
ULTRASOUND GUIDED FINE NEEDLE ASPIRATION OF LEFT AND RIGHT
INDETERMINATE THYROID NODULES
MEDICATIONS:
1% Lidocaine.
ANESTHESIA/SEDATION:
No sedation medication given.
TECHNIQUE: Informed written consent was obtained from the patient after a
discussion of the risks, benefits and alternatives to treatment.
Questions regarding the procedure were encouraged and answered. A
timeout was performed prior to the initiation of the procedure.

[Series 1: us thyroid biopsy · 0.05mm/px · 40 acquisitions, 13 frames shown]
[im 1/40]
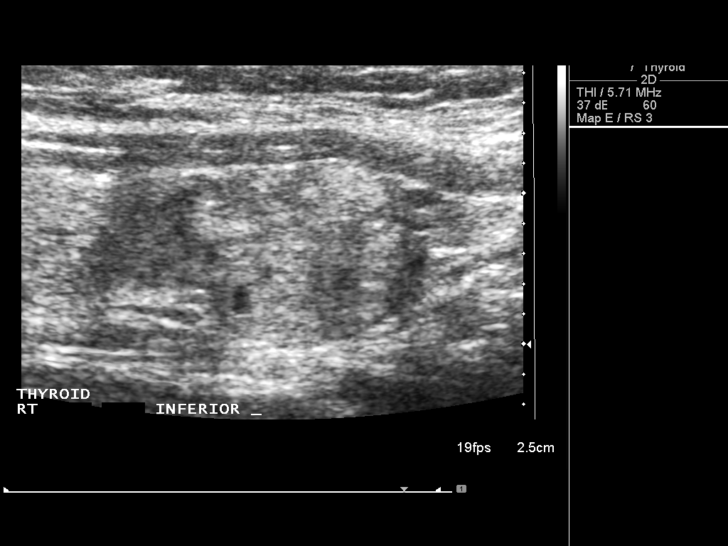
[im 4/40]
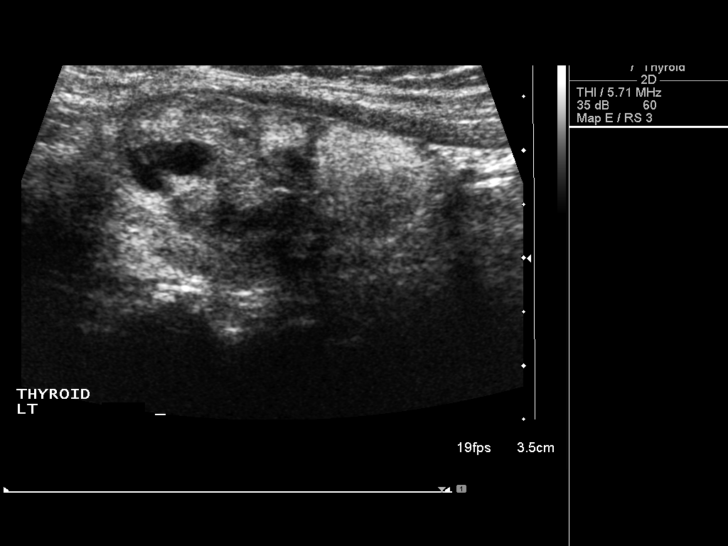
[im 7/40]
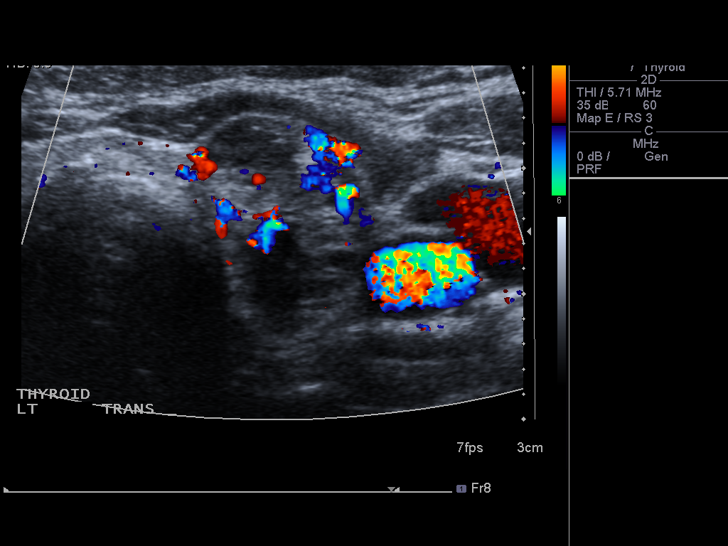
[im 10/40]
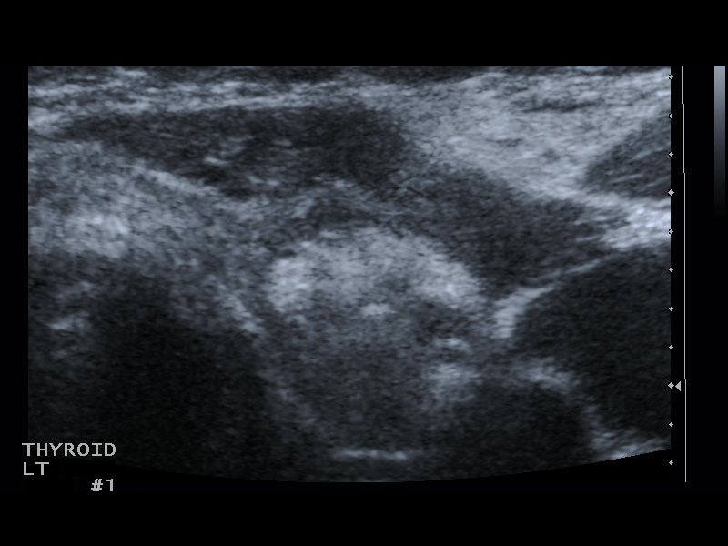
[im 14/40]
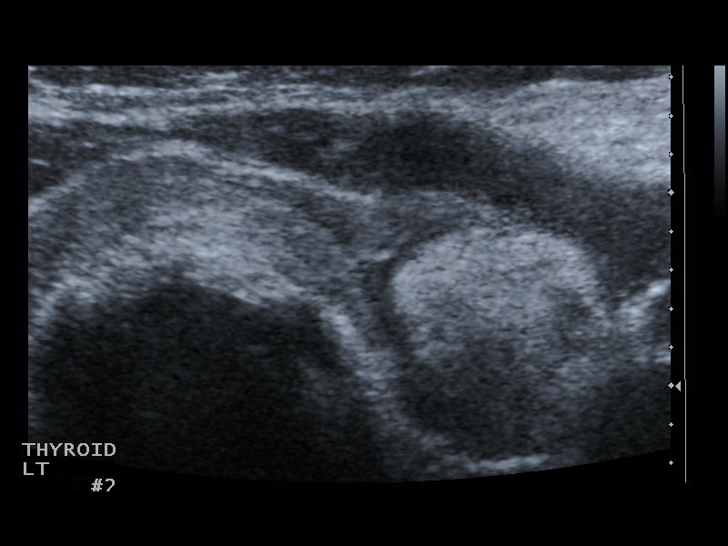
[im 17/40]
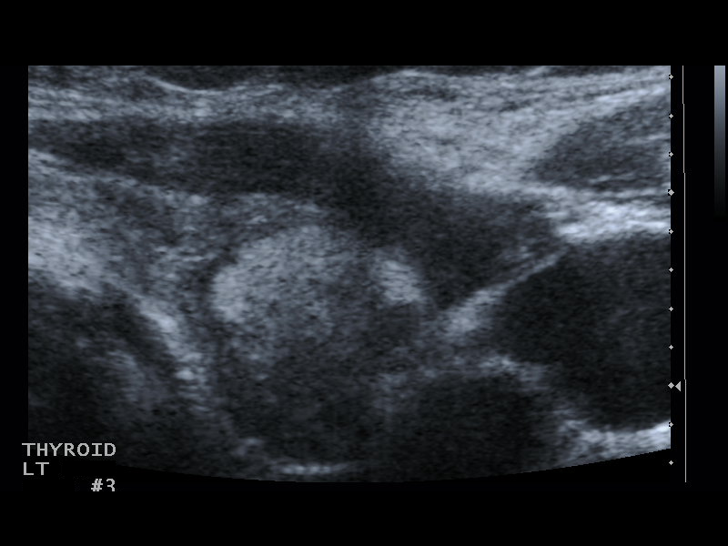
[im 20/40]
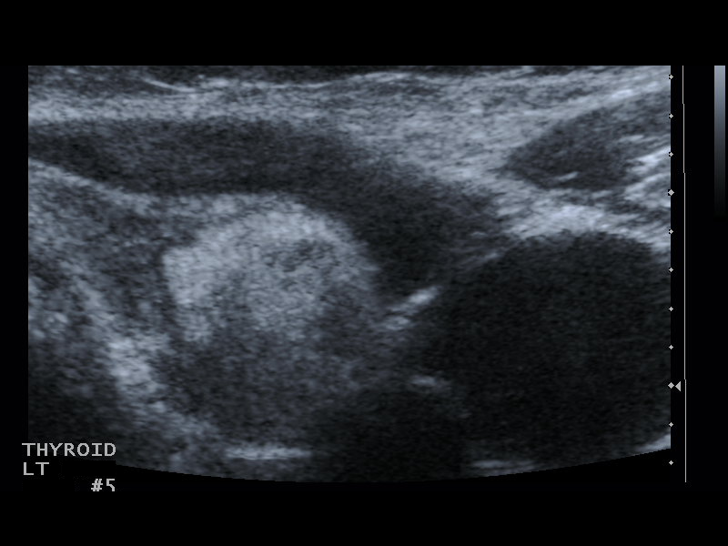
[im 23/40]
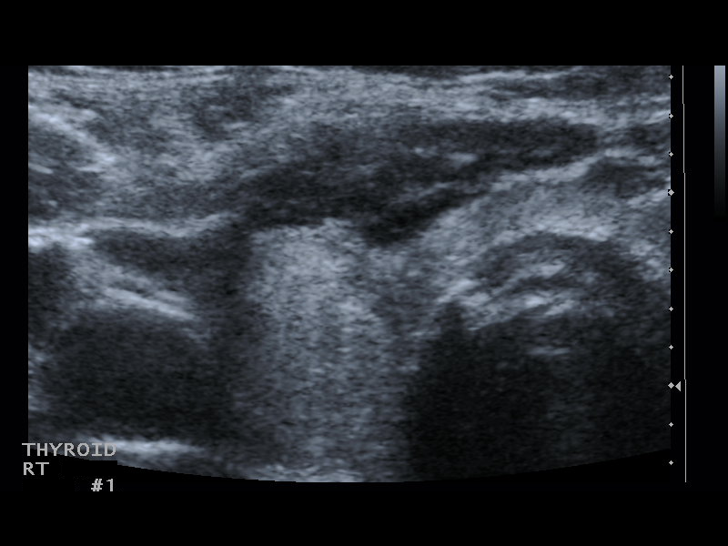
[im 27/40]
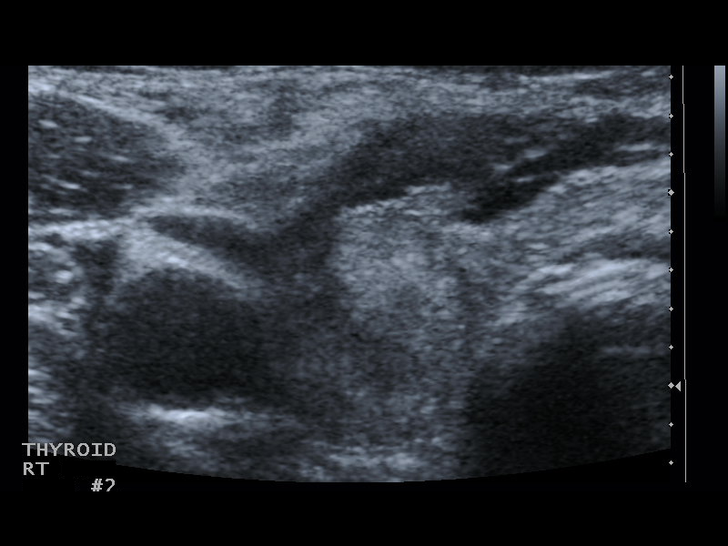
[im 30/40]
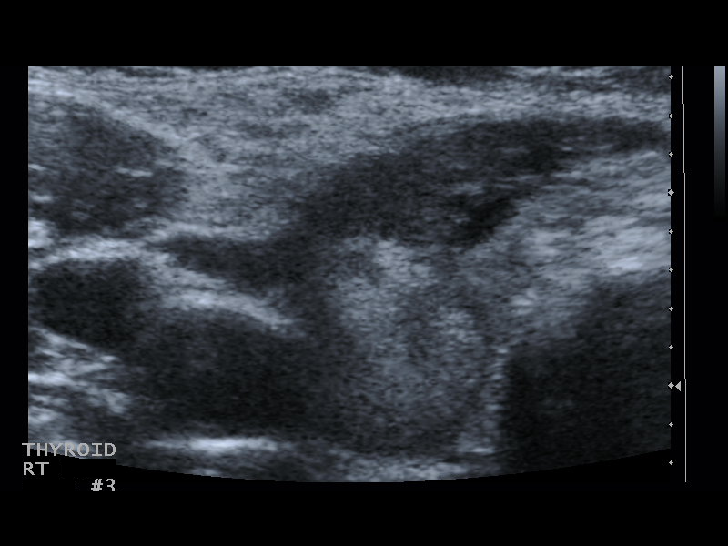
[im 33/40]
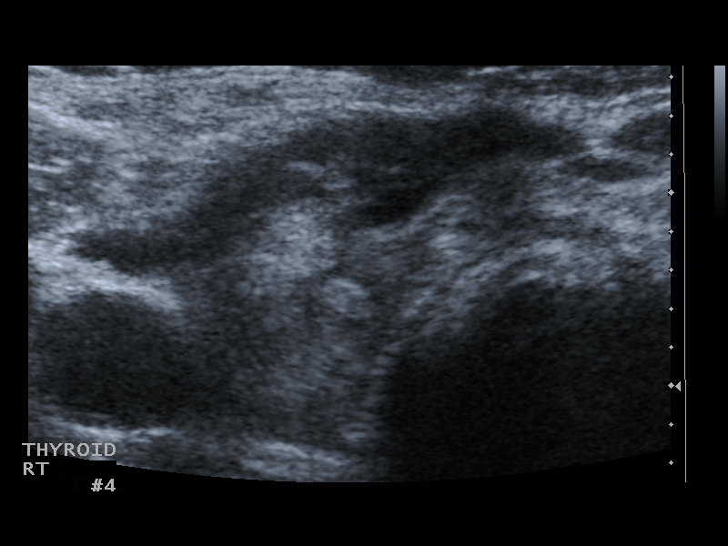
[im 36/40]
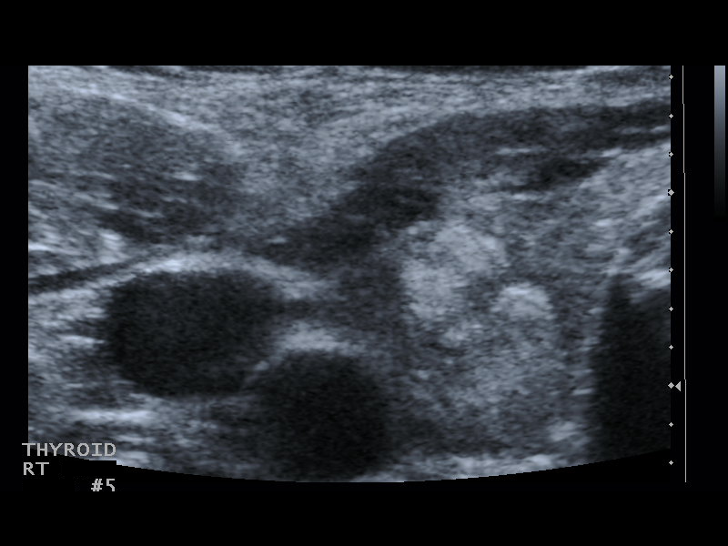
[im 40/40]
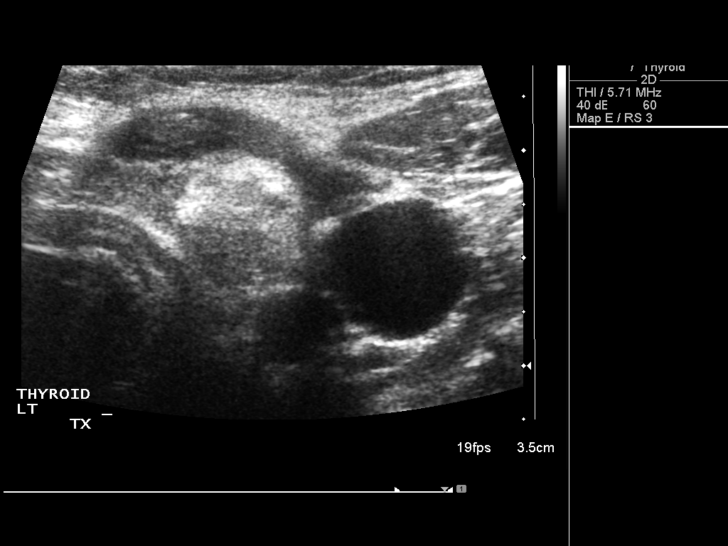

[13 of 25 positions shown; findings below may reference images not displayed]

Pre-procedural ultrasound scanning demonstrated normal-sized thyroid
with 3 cm left and 1.6 cm right nodules

The procedures were planned. The neck was prepped in the usual
sterile fashion, and a sterile drape was applied covering the
operative field. A timeout was performed prior to the initiation of
the procedure. Local anesthesia was provided with 1% lidocaine.

Under direct ultrasound guidance, 5 FNA biopsies were performed of
the left thyroid nodule with a 25 gauge needle. The samples were
prepared and submitted to pathology with Afirma testing.

Under direct ultrasound guidance, 5 FNA biopsies were performed of
the right thyroid nodule with a 25 gauge needle. The samples were
prepared and submitted to pathology with Afirma testing.

Limited post procedural scanning was negative for hematoma or
additional complication. Dressings were placed. The patient
tolerated the above procedures procedure well without immediate
postprocedural complication.
IMPRESSION: Technically successful ultrasound guided fine needle aspiration of
the left and right thyroid nodules.

## 2018-02-03 NOTE — Telephone Encounter (Signed)
Called pt and spoke with her regarding Fosamax, She states she took this med before and she does not want to take it again.  I offered her appt to discuss osteoporosis treatment options with DR. Lavoie she declined and states she will call us back pertaining to her health.  Per pt I will close encounter and wait on her call back

## 2018-09-06 IMAGING — MG 2D DIGITAL SCREENING BILATERAL MAMMOGRAM WITH CAD AND ADJUNCT TO
9 of 12 series · 9 of 28 positions shown · non-contrast
Comparison: Previous exam(s).

CLINICAL DATA: Screening.

EXAM:
2D DIGITAL SCREENING BILATERAL MAMMOGRAM WITH CAD AND ADJUNCT TOMO

[L CC synth-2D]
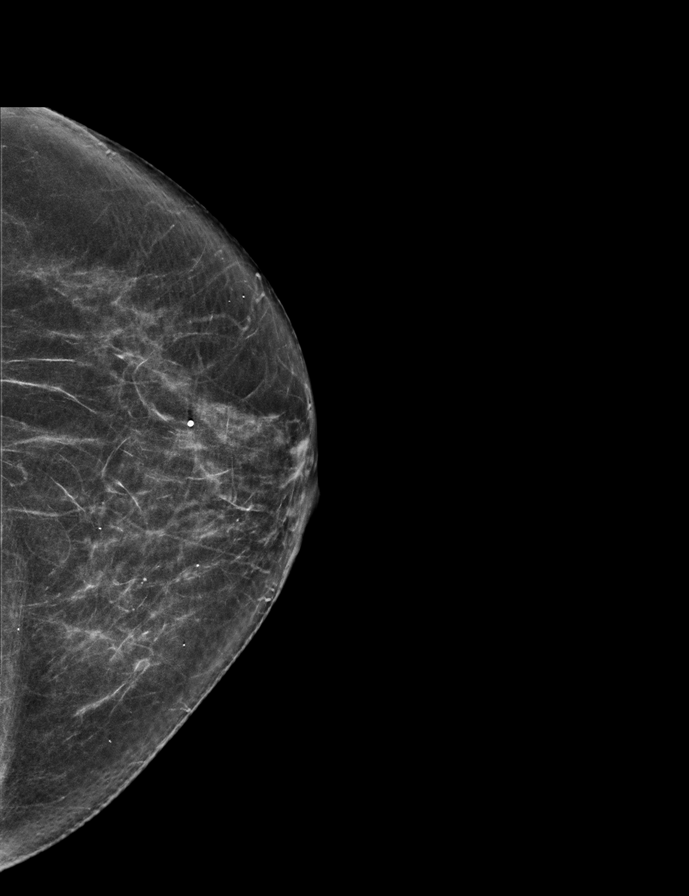

[L MLO synth-2D]
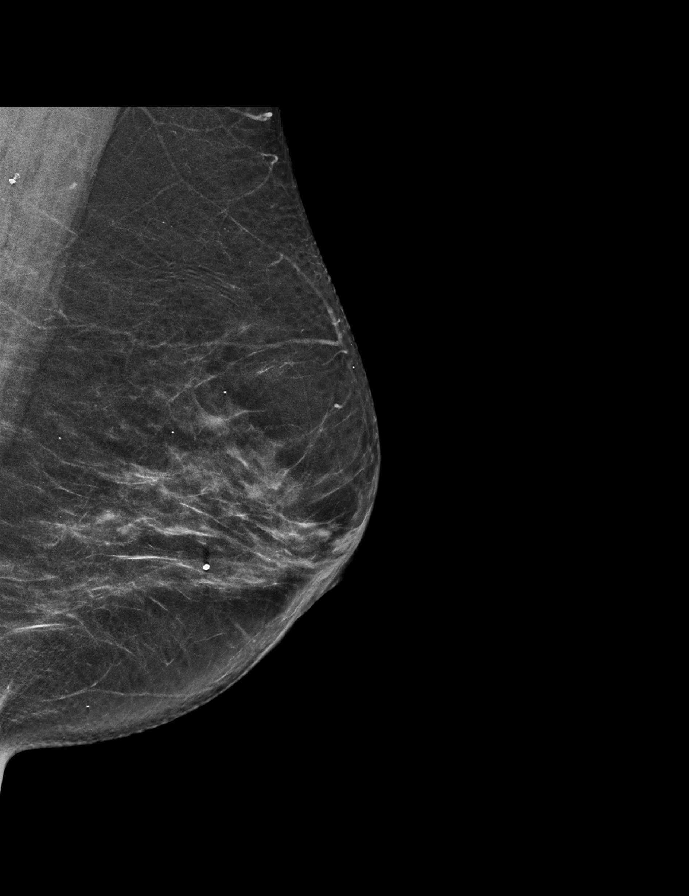

[R CC synth-2D]
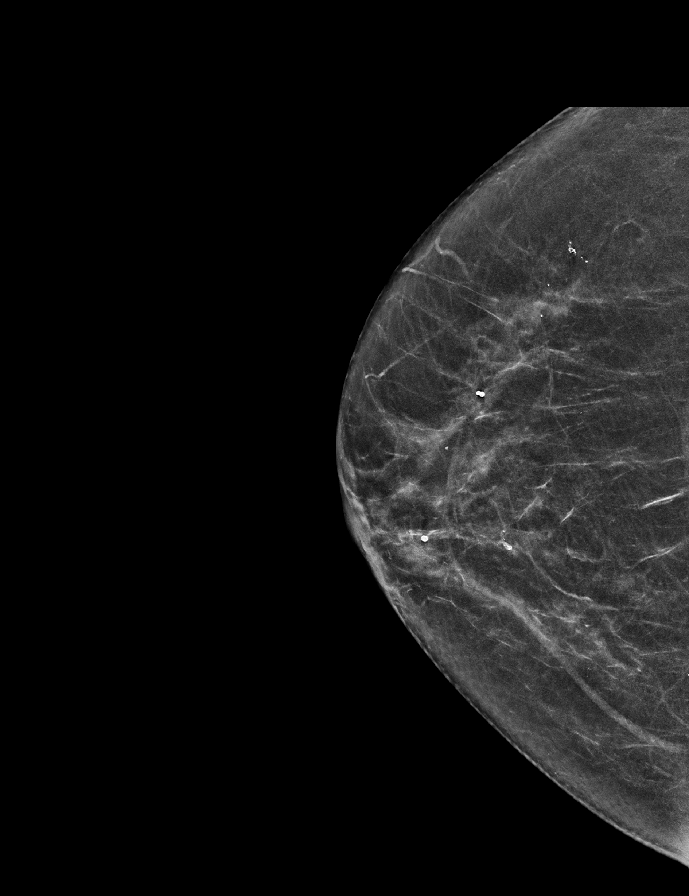

[L MLO]
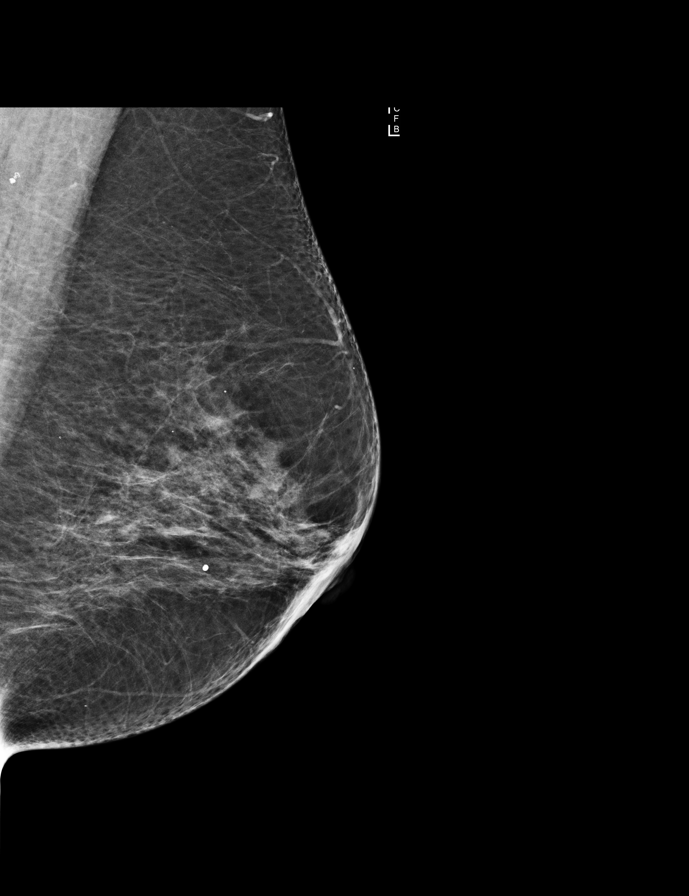

[R MLO]
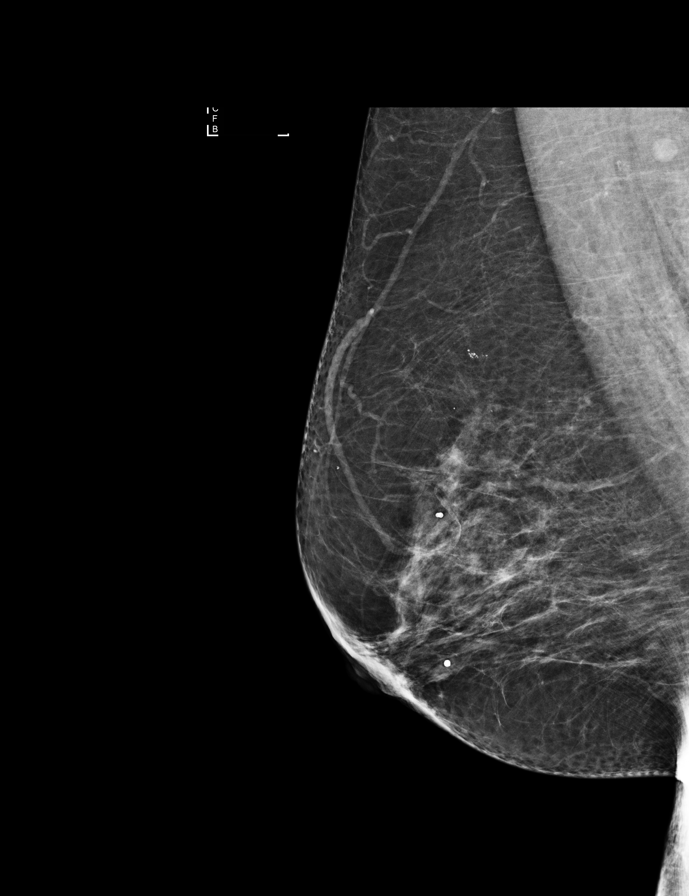

[R MLO synth-2D]
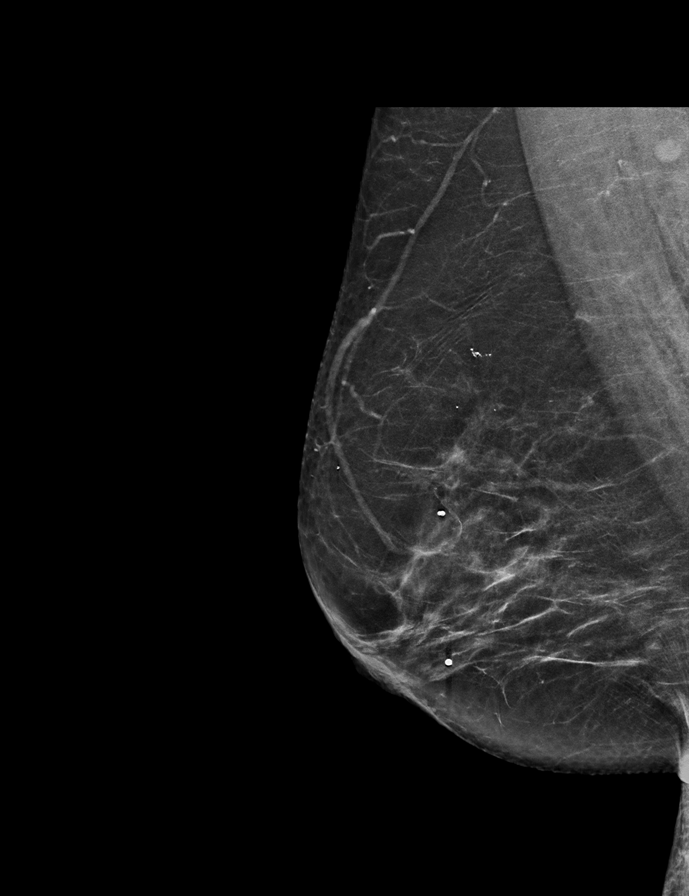

[L CC]
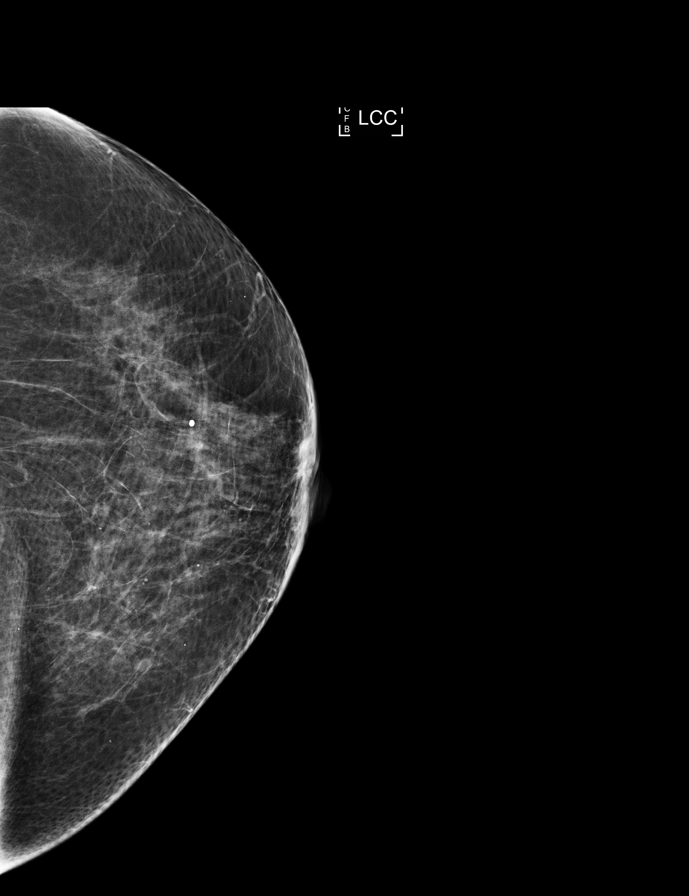

[R CC]
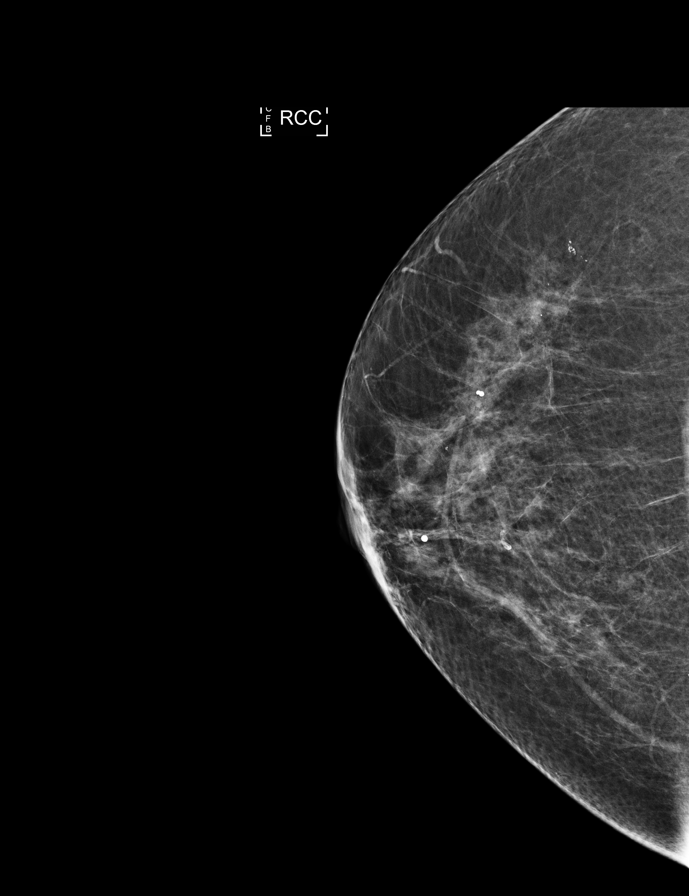

[R CC tomo · tomo slice 29/58.0]
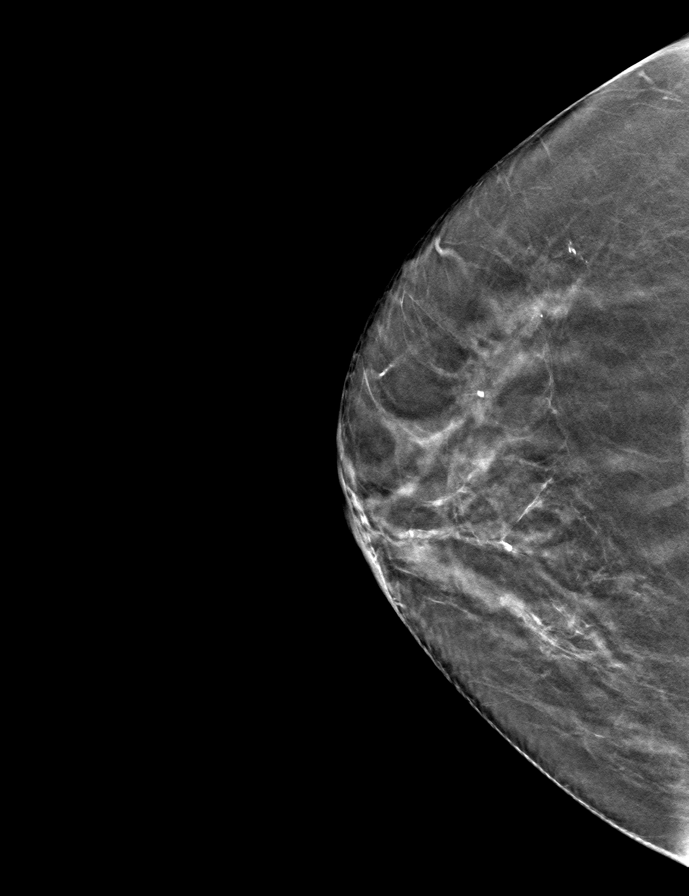

[9 of 28 positions shown; findings below may reference images not displayed]

ACR Breast Density Category b: There are scattered areas of
fibroglandular density.
FINDINGS: There are no findings suspicious for malignancy. Images were
processed with CAD.
IMPRESSION: No mammographic evidence of malignancy. A result letter of this
screening mammogram will be mailed directly to the patient.

RECOMMENDATION:
Screening mammogram in one year. (Code:97-6-RS4)

BI-RADS CATEGORY  1: Negative.

## 2019-03-21 ENCOUNTER — Other Ambulatory Visit: Payer: Self-pay

## 2019-03-22 ENCOUNTER — Ambulatory Visit (INDEPENDENT_AMBULATORY_CARE_PROVIDER_SITE_OTHER): Payer: Medicare Other | Admitting: Obstetrics & Gynecology

## 2019-03-22 ENCOUNTER — Encounter: Payer: Self-pay | Admitting: Obstetrics & Gynecology

## 2019-03-22 VITALS — BP 126/84 | Ht 58.75 in | Wt 136.0 lb

## 2019-03-22 DIAGNOSIS — M810 Age-related osteoporosis without current pathological fracture: Secondary | ICD-10-CM

## 2019-03-22 DIAGNOSIS — Z01419 Encounter for gynecological examination (general) (routine) without abnormal findings: Secondary | ICD-10-CM

## 2019-03-22 DIAGNOSIS — Z780 Asymptomatic menopausal state: Secondary | ICD-10-CM

## 2019-03-22 NOTE — Patient Instructions (Signed)
1. Well female exam with routine gynecological exam Normal gynecologic exam in menopause.  History of negative Pap test, no further Pap test necessary.  Breast exam normal.  Will schedule screening mammogram now.  Body mass index 27.7.  Very good fitness and nutrition.  Health labs with family physician.  Treating hypercholesterolemia with diet and atorvastatin.  Colonoscopy in 2017.  2. Menopause present Well on no hormone replacement therapy.  No postmenopausal bleeding.  3. Age-related osteoporosis without current pathological fracture Osteoporosis on bone density May 2019.  Treated with Prolia.  Vitamin D supplements, calcium intake of 1200 mg daily and regular weightbearing physical activities to continue.  We will repeat a bone density in May 2021.  Ashley Estrada, fue un placer verle hoy!

## 2019-03-22 NOTE — Progress Notes (Signed)
Ashley Estrada Nov 13, 1945 [id number]   History:    73 y.o. G3P3L3 Married.   RP:  Established patient presenting for annual gyn exam   HPI: Postmenopausal, well on no hormone replacement therapy.  No postmenopausal bleeding.  No pelvic pain.  Occasionally sexually active with no pain.  Urine and bowel movements normal.  Breasts normal.  Body mass index 27.70.  Exercising regularly.  Very healthy nutrition with low cholesterol, low sugar and low-salt.  Health labs with family physician.  Treating hypercholesterolemia with atorvastatin.  Colonoscopy in 2017.  Osteoporosis on Prolia.  Past medical history,surgical history, family history and social history were all reviewed and documented in the EPIC chart.  Gynecologic History No LMP recorded. Patient is postmenopausal. Contraception: post menopausal status Last Pap: 05/2011. Results were: Negative Last mammogram: 12/2017. Results were: Negative Bone Density: 10/2017 Osteoporosis on Prolia. Colonoscopy: 2017  Obstetric History OB History  Gravida Para Term Preterm AB Living  3 3 3     3   SAB TAB Ectopic Multiple Live Births          3    # Outcome Date GA Lbr Len/2nd Weight Sex Delivery Anes PTL Lv  3 Term     F Vag-Spont  N LIV  2 Term     M Vag-Spont  N LIV  1 Term     M Vag-Spont  N LIV     ROS: A ROS was performed and pertinent positives and negatives are included in the history.  GENERAL: No fevers or chills. HEENT: No change in vision, no earache, sore throat or sinus congestion. NECK: No pain or stiffness. CARDIOVASCULAR: No chest pain or pressure. No palpitations. PULMONARY: No shortness of breath, cough or wheeze. GASTROINTESTINAL: No abdominal pain, nausea, vomiting or diarrhea, melena or bright red blood per rectum. GENITOURINARY: No urinary frequency, urgency, hesitancy or dysuria. MUSCULOSKELETAL: No joint or muscle pain, no back pain, no recent trauma. DERMATOLOGIC: No rash, no itching, no lesions. ENDOCRINE: No  polyuria, polydipsia, no heat or cold intolerance. No recent change in weight. HEMATOLOGICAL: No anemia or easy bruising or bleeding. NEUROLOGIC: No headache, seizures, numbness, tingling or weakness. PSYCHIATRIC: No depression, no loss of interest in normal activity or change in sleep pattern.     Exam:   BP 126/84   Ht 4' 10.75" (1.492 m)   Wt 136 lb (61.7 kg)   BMI 27.70 kg/m   Body mass index is 27.7 kg/m.  General appearance : Well developed well nourished female. No acute distress HEENT: Eyes: no retinal hemorrhage or exudates,  Neck supple, trachea midline, no carotid bruits, no thyroidmegaly Lungs: Clear to auscultation, no rhonchi or wheezes, or rib retractions  Heart: Regular rate and rhythm, no murmurs or gallops Breast:Examined in sitting and supine position were symmetrical in appearance, no palpable masses or tenderness,  no skin retraction, no nipple inversion, no nipple discharge, no skin discoloration, no axillary or supraclavicular lymphadenopathy Abdomen: no palpable masses or tenderness, no rebound or guarding Extremities: no edema or skin discoloration or tenderness  Pelvic: Vulva: Normal             Vagina: No gross lesions or discharge  Cervix: No gross lesions or discharge  Uterus  AV, normal size, shape and consistency, non-tender and mobile  Adnexa  Without masses or tenderness  Anus: Normal   Assessment/Plan:  73 y.o. female for annual exam   1. Well female exam with routine gynecological exam Normal gynecologic exam in menopause.  History of negative Pap test, no further Pap test necessary.  Breast exam normal.  Will schedule screening mammogram now.  Body mass index 27.7.  Very good fitness and nutrition.  Health labs with family physician.  Treating hypercholesterolemia with diet and atorvastatin.  Colonoscopy in 2017.  2. Menopause present Well on no hormone replacement therapy.  No postmenopausal bleeding.  3. Age-related osteoporosis without  current pathological fracture Osteoporosis on bone density May 2019.  Treated with Prolia.  Vitamin D supplements, calcium intake of 1200 mg daily and regular weightbearing physical activities to continue.  We will repeat a bone density in May 2021.  Genia Del MD, 10:14 AM 03/22/2019

## 2019-04-05 ENCOUNTER — Encounter: Payer: Self-pay | Admitting: Gynecology

## 2019-07-18 ENCOUNTER — Ambulatory Visit: Payer: Medicare Other

## 2019-09-13 IMAGING — MG DIGITAL SCREENING BILATERAL MAMMOGRAM WITH TOMO AND CAD
7 series · 8 of 23 positions shown · non-contrast
Comparison: Previous exam(s).

CLINICAL DATA: Screening.

EXAM:
DIGITAL SCREENING BILATERAL MAMMOGRAM WITH TOMO AND CAD

[L MLO synth-2D]
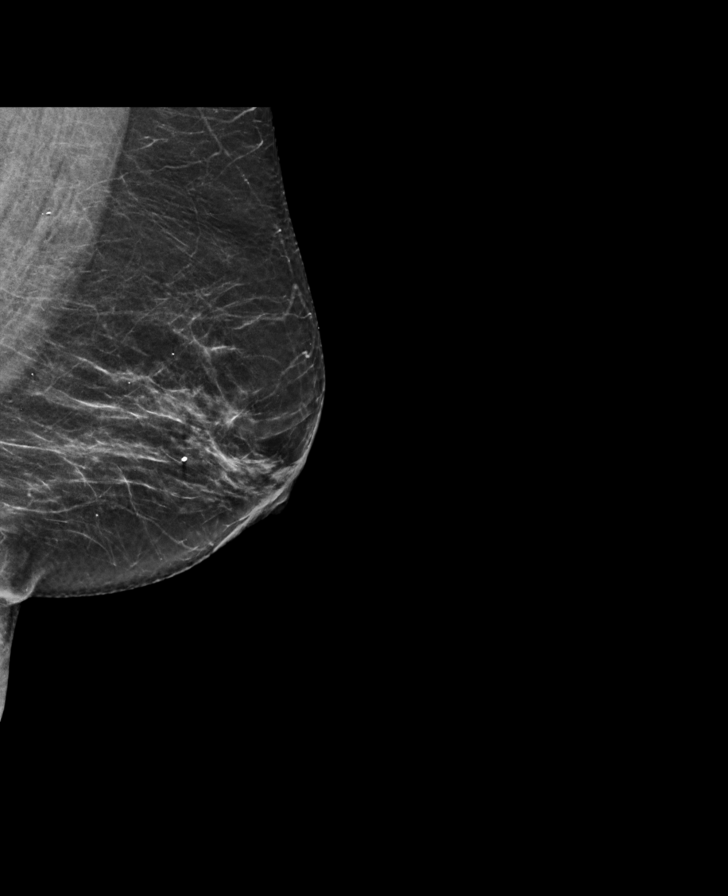

[R MLO synth-2D]
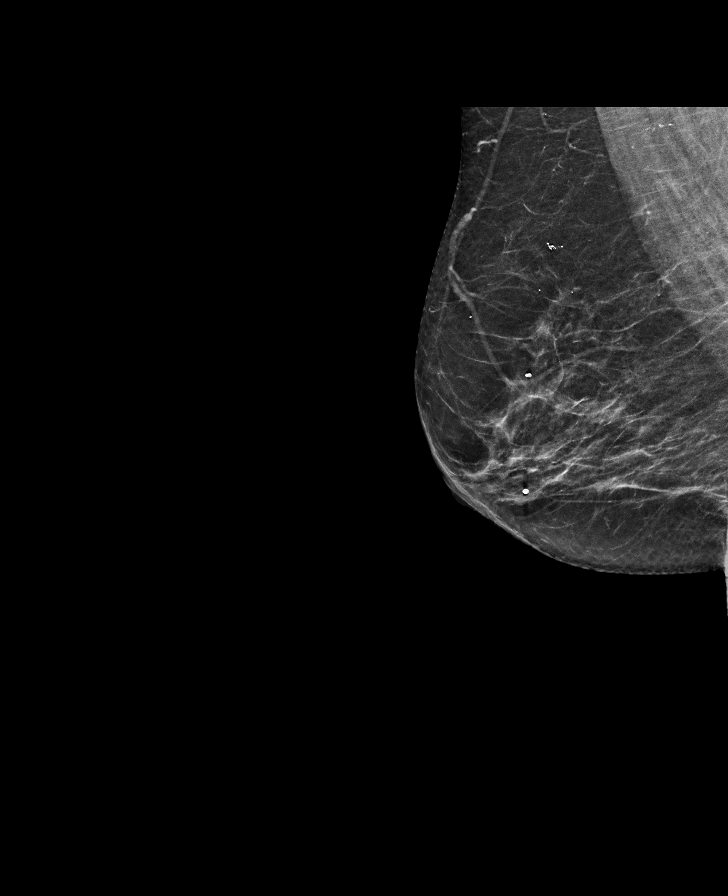

[R CC synth-2D]
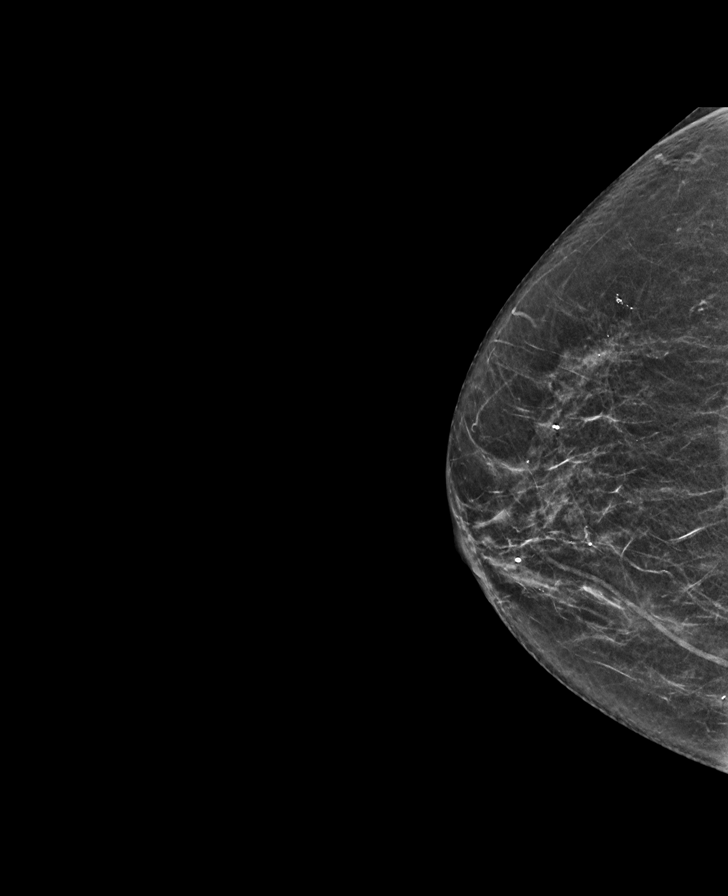

[L CC tomo · 2 of 62 frames shown]
[frame 21/62]
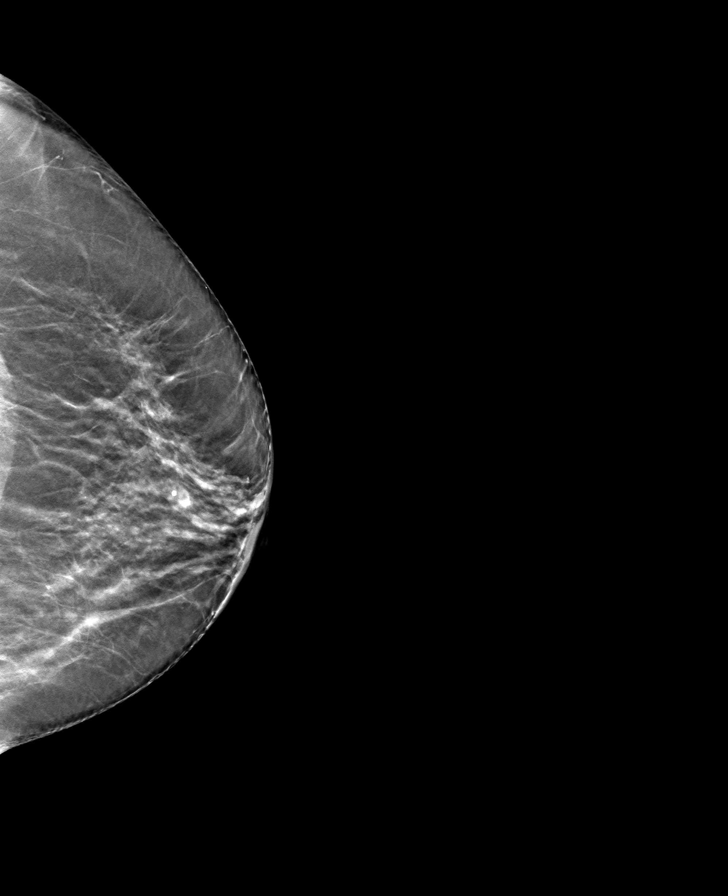
[frame 31/62]
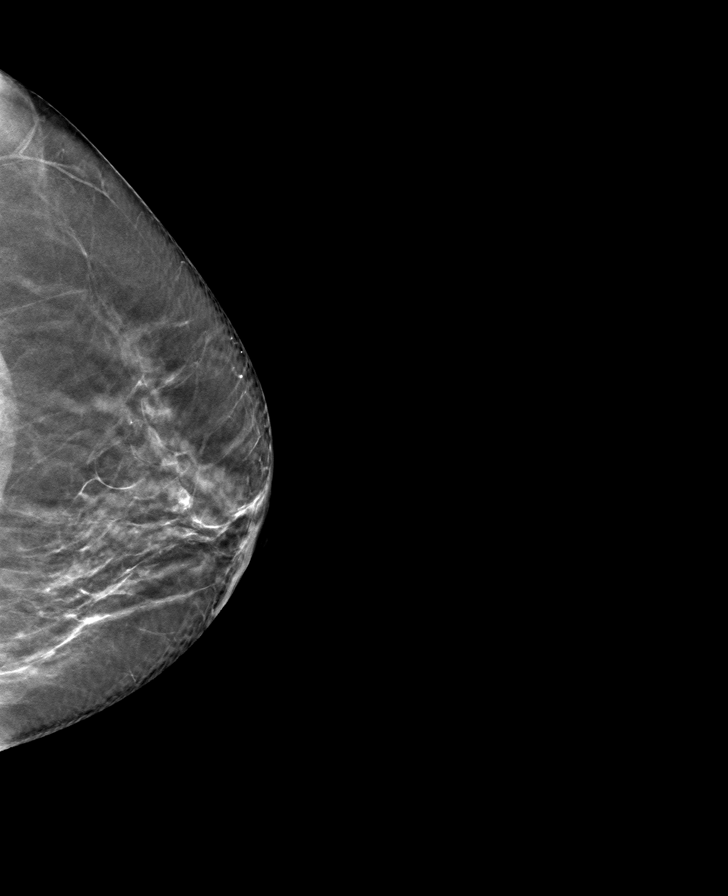

[R MLO tomo · tomo slice 31/60.0]
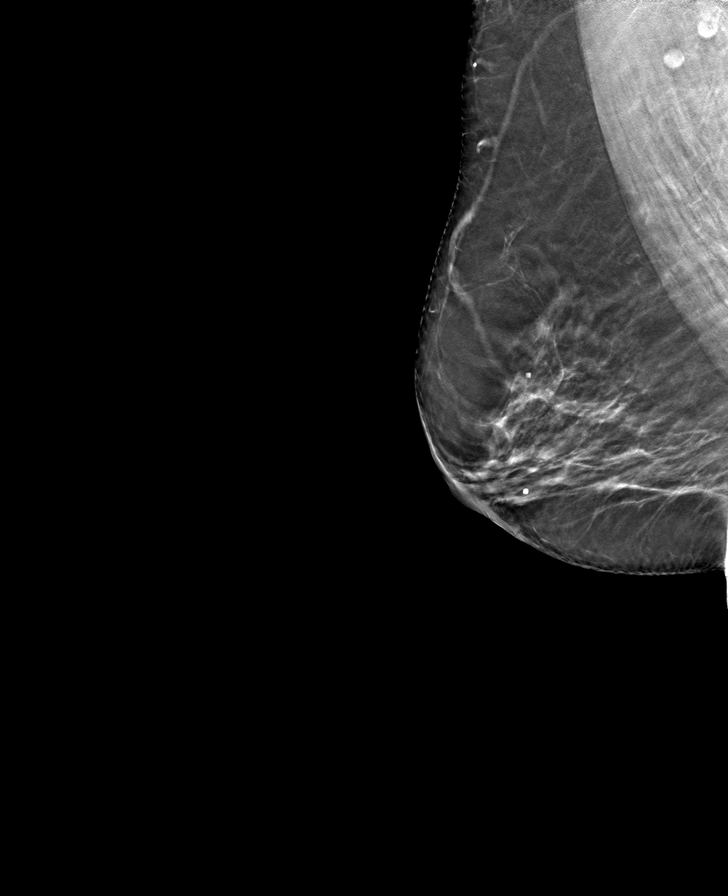

[R CC tomo · tomo slice 29/57.0]
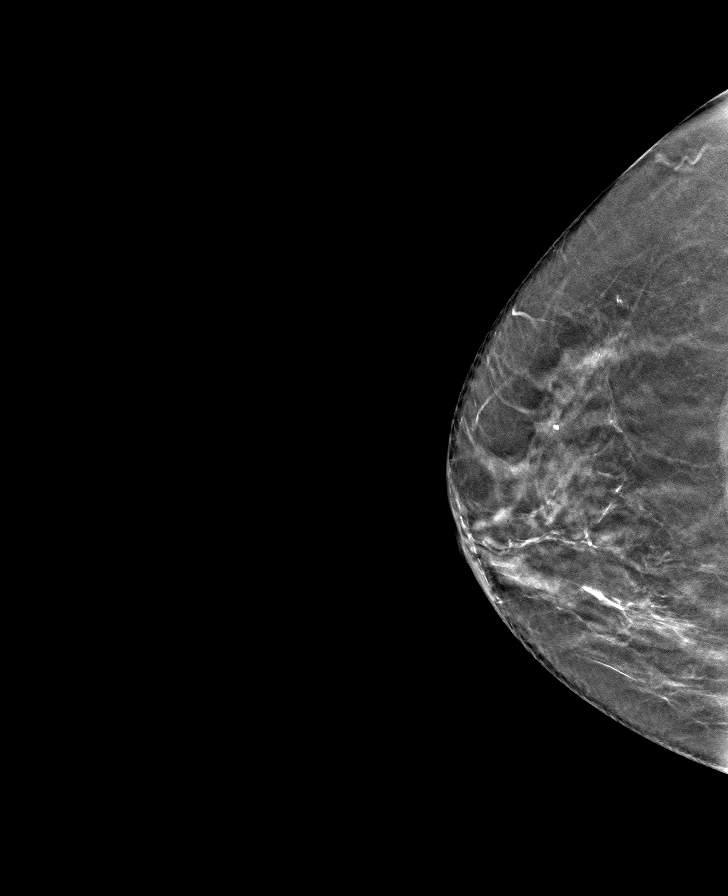

[L MLO tomo · tomo slice 33/64.0]
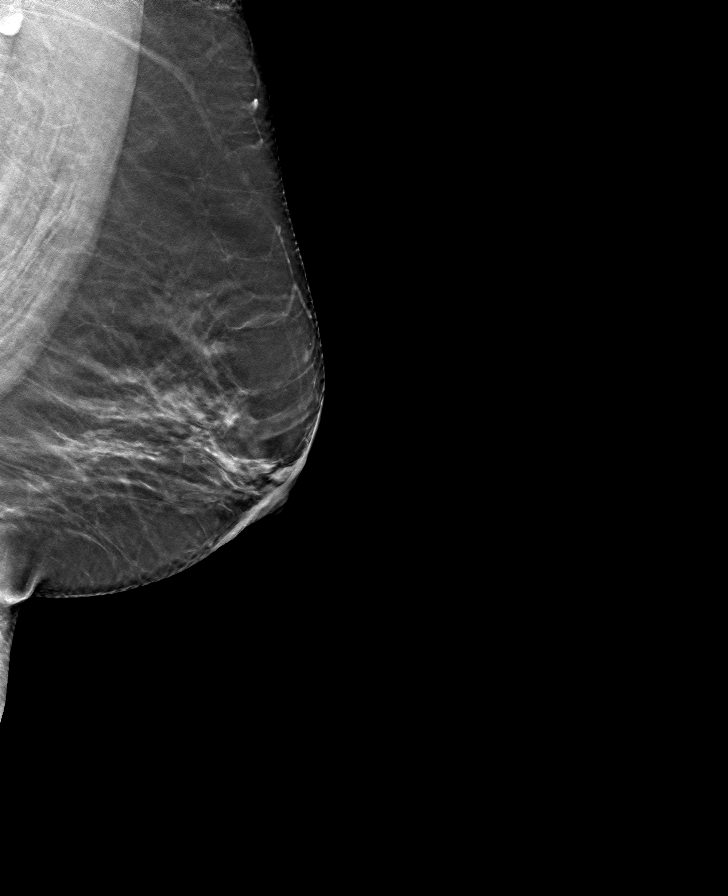

[8 of 23 positions shown; findings below may reference images not displayed]

ACR Breast Density Category b: There are scattered areas of
fibroglandular density.
FINDINGS: There are no findings suspicious for malignancy. Images were
processed with CAD.
IMPRESSION: No mammographic evidence of malignancy. A result letter of this
screening mammogram will be mailed directly to the patient.

RECOMMENDATION:
Screening mammogram in one year. (Code:[id number])

BI-RADS CATEGORY  1: Negative.

## 2020-01-11 ENCOUNTER — Other Ambulatory Visit: Payer: Self-pay | Admitting: Internal Medicine

## 2020-01-11 DIAGNOSIS — Z1231 Encounter for screening mammogram for malignant neoplasm of breast: Secondary | ICD-10-CM

## 2020-01-12 ENCOUNTER — Other Ambulatory Visit: Payer: Self-pay

## 2020-01-12 ENCOUNTER — Ambulatory Visit
Admission: RE | Admit: 2020-01-12 | Discharge: 2020-01-12 | Disposition: A | Discharge status: Home / Self Care | Payer: Medicare Other | Source: Ambulatory Visit | Attending: Internal Medicine | Admitting: Internal Medicine

## 2020-01-12 DIAGNOSIS — Z1231 Encounter for screening mammogram for malignant neoplasm of breast: Secondary | ICD-10-CM

## 2020-03-22 ENCOUNTER — Encounter: Payer: Medicare Other | Admitting: Obstetrics & Gynecology

## 2020-05-02 ENCOUNTER — Encounter: Payer: Self-pay | Admitting: Obstetrics & Gynecology

## 2020-05-02 ENCOUNTER — Ambulatory Visit (INDEPENDENT_AMBULATORY_CARE_PROVIDER_SITE_OTHER): Payer: Medicare Other | Admitting: Obstetrics & Gynecology

## 2020-05-02 ENCOUNTER — Other Ambulatory Visit: Payer: Self-pay

## 2020-05-02 VITALS — BP 140/80 | Ht 58.75 in | Wt 136.0 lb

## 2020-05-02 DIAGNOSIS — Z01419 Encounter for gynecological examination (general) (routine) without abnormal findings: Secondary | ICD-10-CM

## 2020-05-02 DIAGNOSIS — Z780 Asymptomatic menopausal state: Secondary | ICD-10-CM | POA: Diagnosis not present

## 2020-05-02 DIAGNOSIS — M810 Age-related osteoporosis without current pathological fracture: Secondary | ICD-10-CM | POA: Diagnosis not present

## 2020-05-02 DIAGNOSIS — Z23 Encounter for immunization: Secondary | ICD-10-CM | POA: Diagnosis not present

## 2020-05-02 NOTE — Progress Notes (Signed)
Carrine Kroboth [birth year] [id number]   History:    74 y.o. G3P3L3 Married.   RP:  Established patient presenting for annual gyn exam   HPI: Postmenopausal, well on no hormone replacement therapy.  No postmenopausal bleeding.  No pelvic pain.  Occasionally sexually active with no pain.  Urine and bowel movements normal.  Using a CS as needed for external hemorrhoids.  Breasts normal.  Body mass index 27.70.  Exercising regularly.  Very healthy nutrition with low cholesterol, low sugar and low-salt.  Health labs with family physician.  Treating hypercholesterolemia with atorvastatin.  Colonoscopy in 2017.  Osteoporosis on Prolia.   Past medical history,surgical history, family history and social history were all reviewed and documented in the EPIC chart.  Gynecologic History No LMP recorded. Patient is postmenopausal.  Obstetric History OB History  Gravida Para Term Preterm AB Living  3 3 3     3   SAB TAB Ectopic Multiple Live Births          3    # Outcome Date GA Lbr Len/2nd Weight Sex Delivery Anes PTL Lv  3 Term     F Vag-Spont  N LIV  2 Term     M Vag-Spont  N LIV  1 Term     M Vag-Spont  N LIV     ROS: A ROS was performed and pertinent positives and negatives are included in the history.  GENERAL: No fevers or chills. HEENT: No change in vision, no earache, sore throat or sinus congestion. NECK: No pain or stiffness. CARDIOVASCULAR: No chest pain or pressure. No palpitations. PULMONARY: No shortness of breath, cough or wheeze. GASTROINTESTINAL: No abdominal pain, nausea, vomiting or diarrhea, melena or bright red blood per rectum. GENITOURINARY: No urinary frequency, urgency, hesitancy or dysuria. MUSCULOSKELETAL: No joint or muscle pain, no back pain, no recent trauma. DERMATOLOGIC: No rash, no itching, no lesions. ENDOCRINE: No polyuria, polydipsia, no heat or cold intolerance. No recent change in weight. HEMATOLOGICAL: No anemia or easy bruising or bleeding.  NEUROLOGIC: No headache, seizures, numbness, tingling or weakness. PSYCHIATRIC: No depression, no loss of interest in normal activity or change in sleep pattern.     Exam:   BP 140/80   Ht 4' 10.75" (1.492 m)   Wt 136 lb (61.7 kg)   BMI 27.70 kg/m   Body mass index is 27.7 kg/m.  General appearance : Well developed well nourished female. No acute distress HEENT: Eyes: no retinal hemorrhage or exudates,  Neck supple, trachea midline, no carotid bruits, no thyroidmegaly Lungs: Clear to auscultation, no rhonchi or wheezes, or rib retractions  Heart: Regular rate and rhythm, no murmurs or gallops Breast:Examined in sitting and supine position were symmetrical in appearance, no palpable masses or tenderness,  no skin retraction, no nipple inversion, no nipple discharge, no skin discoloration, no axillary or supraclavicular lymphadenopathy Abdomen: no palpable masses or tenderness, no rebound or guarding Extremities: no edema or skin discoloration or tenderness  Pelvic: Vulva: Normal             Vagina: No gross lesions or discharge  Cervix: No gross lesions or discharge.  Pap reflex done.  Uterus AV, normal size, shape and consistency, non-tender and mobile  Adnexa  Without masses or tenderness  Anus: Normal   Assessment/Plan:  74 y.o. female for annual exam   1. Encounter for routine gynecological examination with Papanicolaou smear of cervix Normal gynecologic exam.  Pap reflex done.  Breast exam normal.  Screening mammogram  July 2021 was negative.  Colonoscopy normal in 2017.  Body mass index stable at 27.7.  Continue with fitness and very healthy nutrition.  Health labs with family physician.  2. Postmenopause Well on no hormone replacement therapy.  No postmenopausal bleeding.  3. Age-related osteoporosis without current pathological fracture Osteoporosis on Prolia.  We will repeat a bone density here now.  Vitamin D supplements, calcium intake of 1500 mg daily and regular  weightbearing physical activities to continue. - DG Bone Density; Future  Genia Del MD, 8:11 AM 05/02/2020

## 2020-05-02 NOTE — Addendum Note (Signed)
Addended by: Berna Spare A on: 05/02/2020 09:00 AM   Modules accepted: Orders

## 2020-05-02 NOTE — Addendum Note (Signed)
Addended by: Berna Spare A on: 05/02/2020 08:55 AM   Modules accepted: Orders

## 2020-05-03 LAB — PAP IG W/ RFLX HPV ASCU

## 2020-05-07 ENCOUNTER — Ambulatory Visit (INDEPENDENT_AMBULATORY_CARE_PROVIDER_SITE_OTHER): Payer: Medicare Other

## 2020-05-07 ENCOUNTER — Other Ambulatory Visit: Payer: Self-pay

## 2020-05-07 ENCOUNTER — Other Ambulatory Visit: Payer: Self-pay | Admitting: Obstetrics & Gynecology

## 2020-05-07 DIAGNOSIS — M810 Age-related osteoporosis without current pathological fracture: Secondary | ICD-10-CM

## 2020-05-07 DIAGNOSIS — Z780 Asymptomatic menopausal state: Secondary | ICD-10-CM

## 2020-12-17 ENCOUNTER — Other Ambulatory Visit: Payer: Self-pay | Admitting: Internal Medicine

## 2020-12-17 DIAGNOSIS — Z1231 Encounter for screening mammogram for malignant neoplasm of breast: Secondary | ICD-10-CM

## 2021-02-07 ENCOUNTER — Other Ambulatory Visit: Payer: Self-pay

## 2021-02-07 ENCOUNTER — Ambulatory Visit
Admission: RE | Admit: 2021-02-07 | Discharge: 2021-02-07 | Disposition: A | Discharge status: Home / Self Care | Payer: Medicare Other | Source: Ambulatory Visit | Attending: Internal Medicine | Admitting: Internal Medicine

## 2021-02-07 DIAGNOSIS — Z1231 Encounter for screening mammogram for malignant neoplasm of breast: Secondary | ICD-10-CM

## 2021-05-05 ENCOUNTER — Ambulatory Visit (INDEPENDENT_AMBULATORY_CARE_PROVIDER_SITE_OTHER): Payer: Medicare Other | Admitting: Obstetrics & Gynecology

## 2021-05-05 ENCOUNTER — Encounter: Payer: Self-pay | Admitting: Obstetrics & Gynecology

## 2021-05-05 ENCOUNTER — Other Ambulatory Visit: Payer: Self-pay

## 2021-05-05 VITALS — BP 124/80 | Ht 58.5 in | Wt 138.0 lb

## 2021-05-05 DIAGNOSIS — Z01419 Encounter for gynecological examination (general) (routine) without abnormal findings: Secondary | ICD-10-CM | POA: Diagnosis not present

## 2021-05-05 DIAGNOSIS — M810 Age-related osteoporosis without current pathological fracture: Secondary | ICD-10-CM | POA: Diagnosis not present

## 2021-05-05 DIAGNOSIS — Z780 Asymptomatic menopausal state: Secondary | ICD-10-CM | POA: Diagnosis not present

## 2021-05-05 NOTE — Progress Notes (Signed)
Shawnika Pepin [birth year] [id number]   History:    75 y.o. G3P3L3 Married.    RP:  Established patient presenting for annual gyn exam    HPI: Postmenopausal, well on no hormone replacement therapy.  No postmenopausal bleeding.  No pelvic pain.  Occasionally sexually active with no pain.  Urine and bowel movements normal.  Using a CS as needed for external hemorrhoids.  Breasts normal.  Body mass index 27.70.  Exercising regularly.  Very healthy nutrition with low cholesterol, low sugar and low-salt.  Health labs with family physician.  Treating hypercholesterolemia with atorvastatin.  Colonoscopy in 2017.  Osteoporosis on Prolia.   Past medical history,surgical history, family history and social history were all reviewed and documented in the EPIC chart.  Gynecologic History No LMP recorded. Patient is postmenopausal.  Obstetric History OB History  Gravida Para Term Preterm AB Living  3 3 3     3   SAB IAB Ectopic Multiple Live Births          3    # Outcome Date GA Lbr Len/2nd Weight Sex Delivery Anes PTL Lv  3 Term     F Vag-Spont  N LIV  2 Term     M Vag-Spont  N LIV  1 Term     M Vag-Spont  N LIV     ROS: A ROS was performed and pertinent positives and negatives are included in the history.  GENERAL: No fevers or chills. HEENT: No change in vision, no earache, sore throat or sinus congestion. NECK: No pain or stiffness. CARDIOVASCULAR: No chest pain or pressure. No palpitations. PULMONARY: No shortness of breath, cough or wheeze. GASTROINTESTINAL: No abdominal pain, nausea, vomiting or diarrhea, melena or bright red blood per rectum. GENITOURINARY: No urinary frequency, urgency, hesitancy or dysuria. MUSCULOSKELETAL: No joint or muscle pain, no back pain, no recent trauma. DERMATOLOGIC: No rash, no itching, no lesions. ENDOCRINE: No polyuria, polydipsia, no heat or cold intolerance. No recent change in weight. HEMATOLOGICAL: No anemia or easy bruising or bleeding.  NEUROLOGIC: No headache, seizures, numbness, tingling or weakness. PSYCHIATRIC: No depression, no loss of interest in normal activity or change in sleep pattern.     Exam:   BP 124/80 (BP Location: Right Arm, Patient Position: Sitting, Cuff Size: Normal)   Ht 4' 10.5" (1.486 m)   Wt 138 lb (62.6 kg)   BMI 28.35 kg/m   Body mass index is 28.35 kg/m.  General appearance : Well developed well nourished female. No acute distress HEENT: Eyes: no retinal hemorrhage or exudates,  Neck supple, trachea midline, no carotid bruits, no thyroidmegaly Lungs: Clear to auscultation, no rhonchi or wheezes, or rib retractions  Heart: Regular rate and rhythm, no murmurs or gallops Breast:Examined in sitting and supine position were symmetrical in appearance, no palpable masses or tenderness,  no skin retraction, no nipple inversion, no nipple discharge, no skin discoloration, no axillary or supraclavicular lymphadenopathy Abdomen: no palpable masses or tenderness, no rebound or guarding Extremities: no edema or skin discoloration or tenderness  Pelvic: Vulva: Normal             Vagina: No gross lesions or discharge  Cervix: No gross lesions or discharge  Uterus  AV, normal size, shape and consistency, non-tender and mobile  Adnexa  Without masses or tenderness  Anus: Normal   Assessment/Plan:  75 y.o. female for annual exam   1. Well female exam with routine gynecological exam Postmenopausal, well on no hormone replacement therapy.  No postmenopausal bleeding.  No pelvic pain.  Occasionally sexually active with no pain.  Pap Neg 04/2020.  Urine and bowel movements normal.  Using a CS as needed for external hemorrhoids.  Breasts normal. Mammo 01/2021 Neg.  Body mass index 27.70.  Exercising regularly.  Very healthy nutrition with low cholesterol, low sugar and low-salt.  Health labs with family physician.  Treating hypercholesterolemia with atorvastatin.  Colonoscopy in 2017.  Osteoporosis on  Prolia.  2. Postmenopause Well on no HRT.  No PMB.    3. Age-related osteoporosis without current pathological fracture  Continue Prolia.  Continue Vit D supplement and Ca++.  Regular weight bearing physical activities.  Genia Del MD, 8:31 AM 05/05/2021

## 2021-05-06 ENCOUNTER — Encounter: Payer: Self-pay | Admitting: Obstetrics & Gynecology

## 2021-09-26 IMAGING — MG DIGITAL SCREENING BILAT W/ TOMO W/ CAD
6 of 10 series · 6 of 30 positions shown · non-contrast
Comparison: Previous exam(s).

CLINICAL DATA: Screening.

EXAM:
DIGITAL SCREENING BILATERAL MAMMOGRAM WITH TOMO AND CAD

[R MLO synth-2D (1 of 2)]
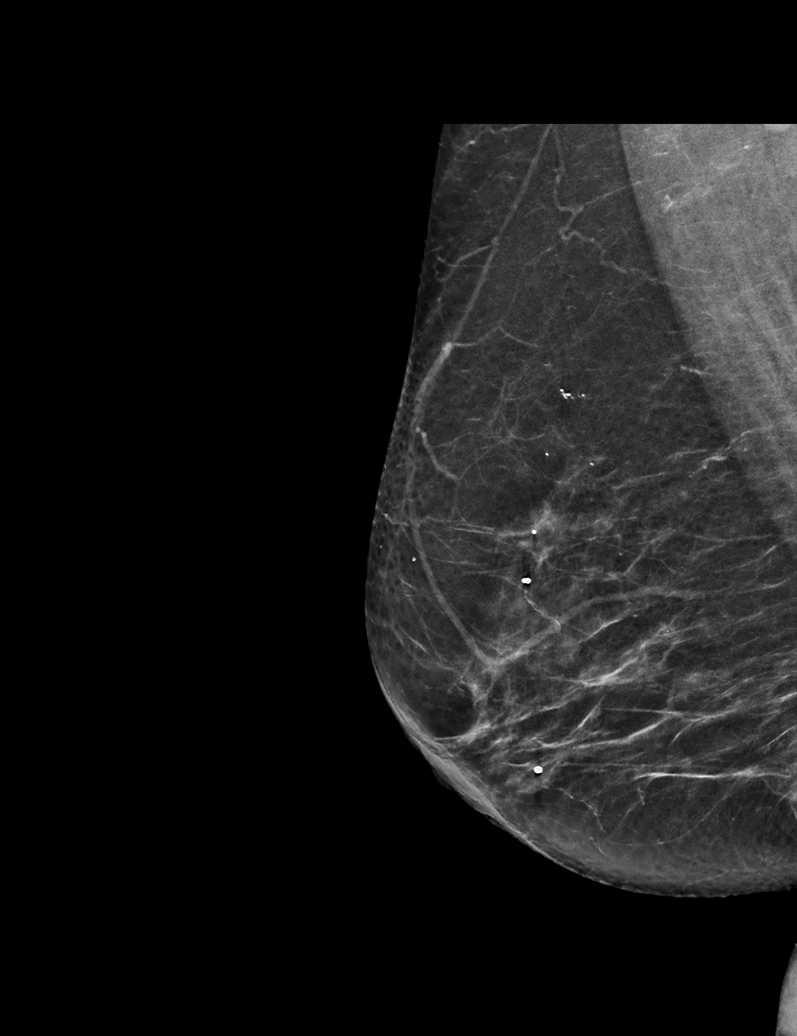

[R CC synth-2D]
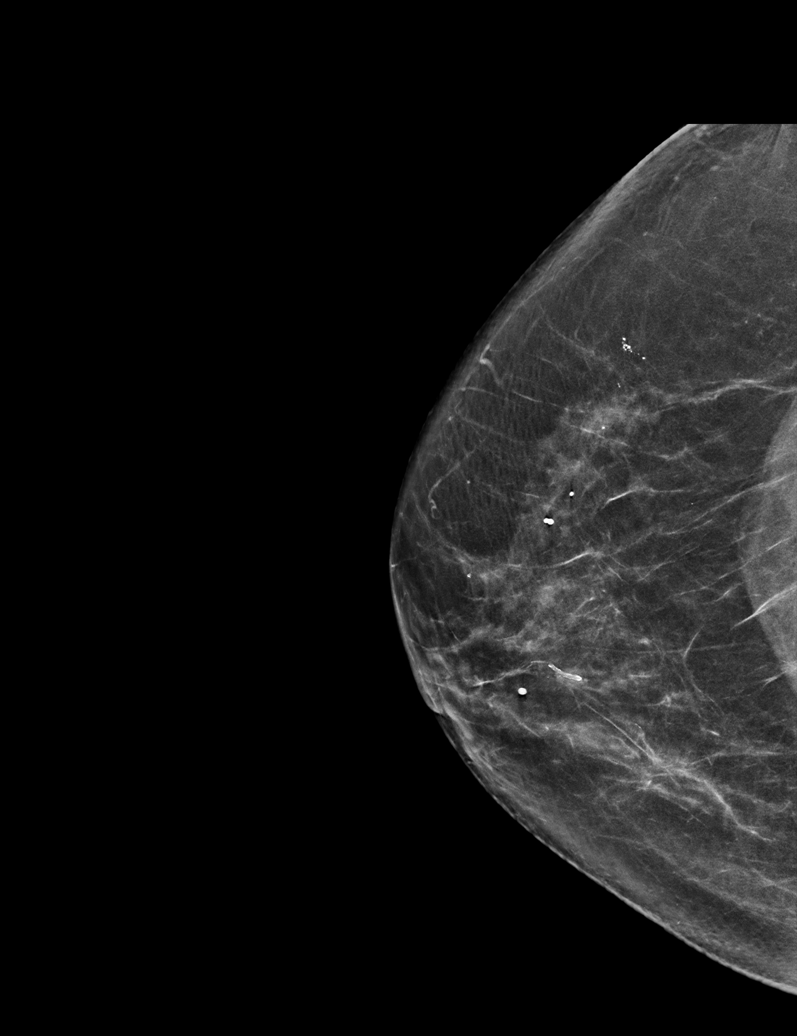

[L MLO synth-2D]
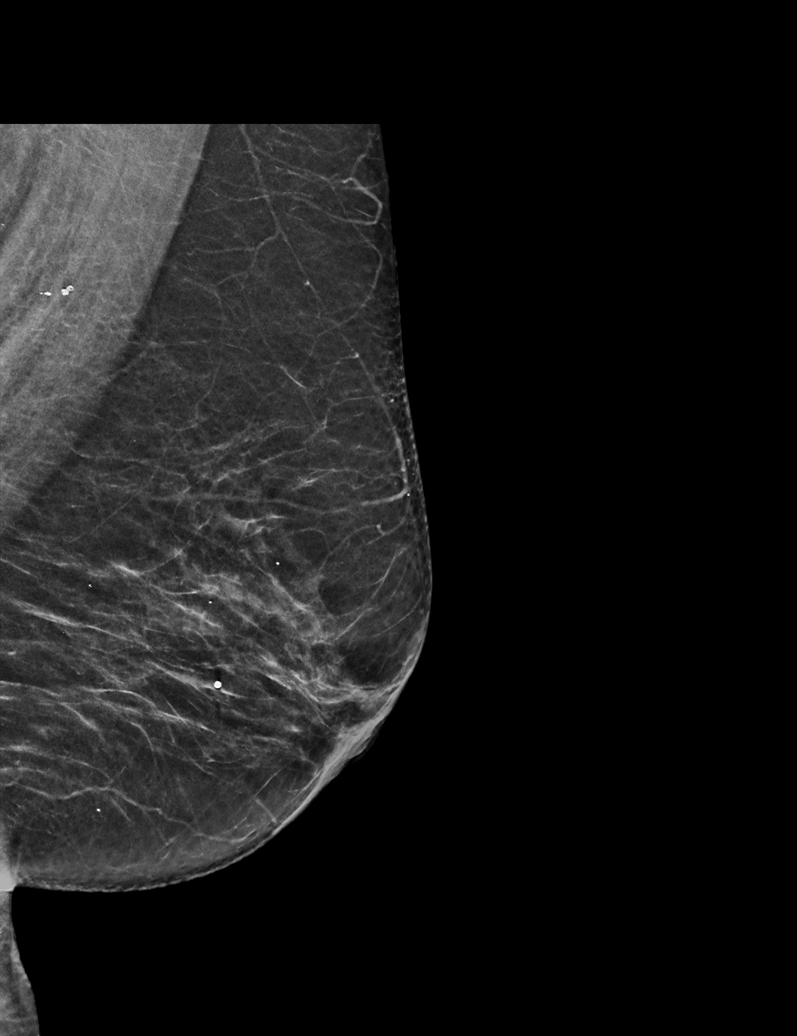

[R MLO synth-2D (2 of 2)]
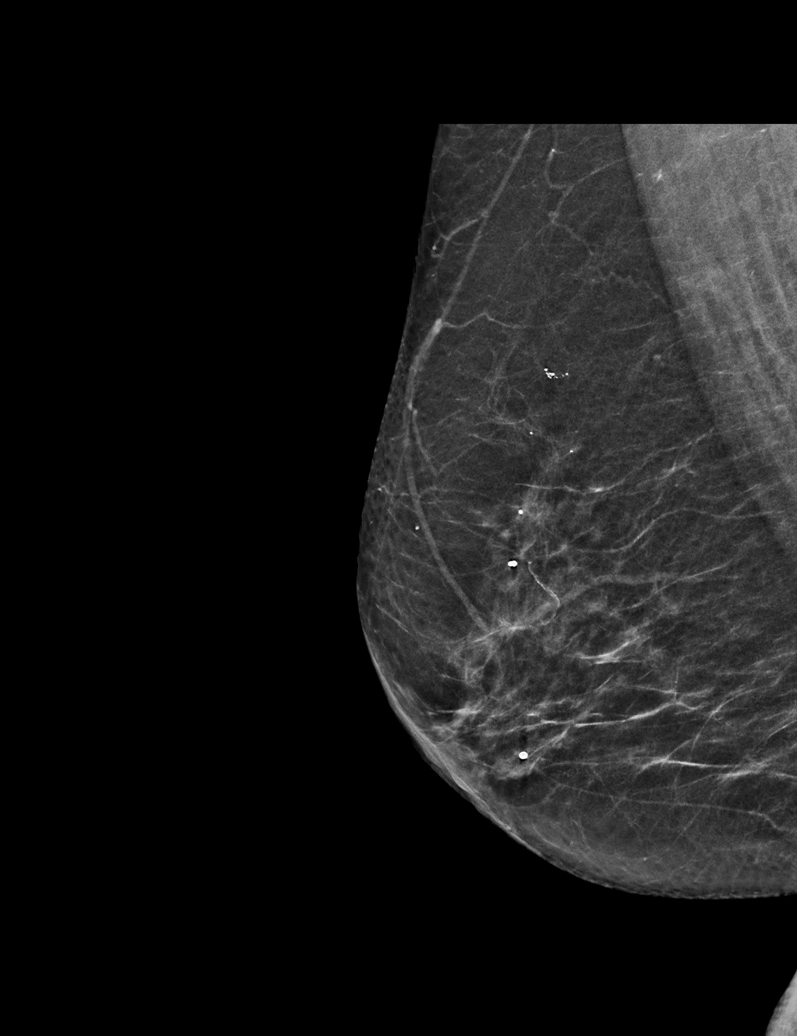

[L CC synth-2D]
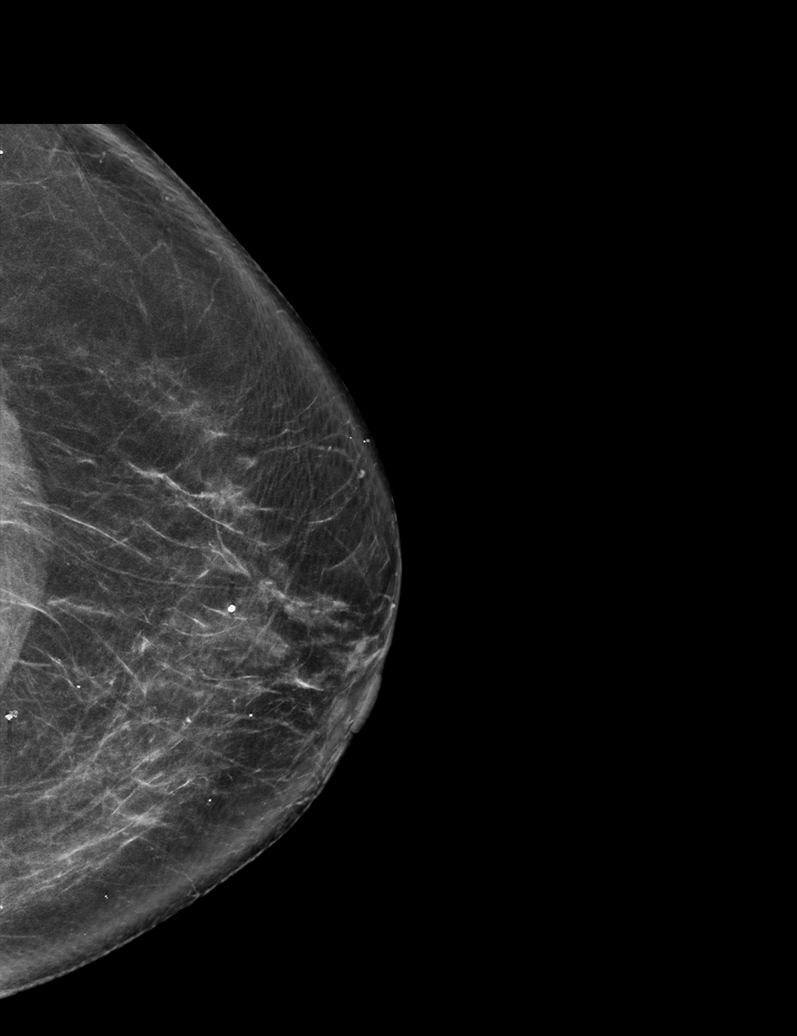

[R CC tomo · tomo slice 32/63.0]
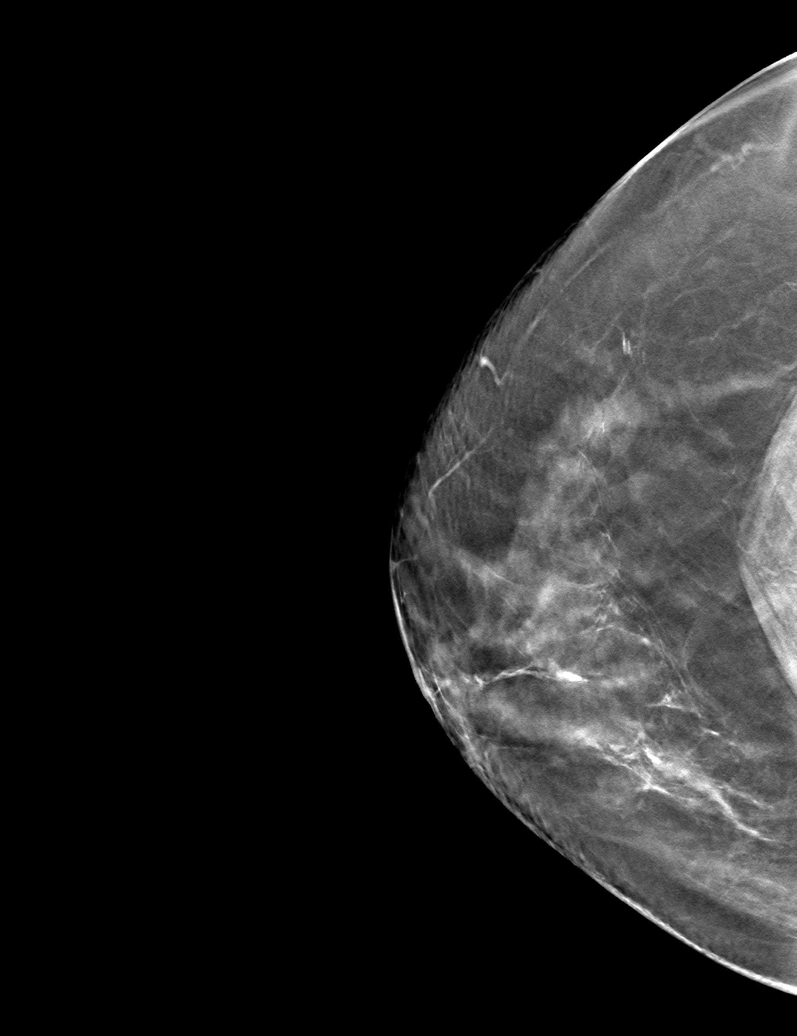

[6 of 30 positions shown; findings below may reference images not displayed]

ACR Breast Density Category b: There are scattered areas of
fibroglandular density.
FINDINGS: There are no findings suspicious for malignancy. Images were
processed with CAD.
IMPRESSION: No mammographic evidence of malignancy. A result letter of this
screening mammogram will be mailed directly to the patient.

RECOMMENDATION:
Screening mammogram in one year. (Code:[id number])

BI-RADS CATEGORY  1: Negative.

## 2022-01-27 ENCOUNTER — Other Ambulatory Visit: Payer: Self-pay | Admitting: Internal Medicine

## 2022-01-27 DIAGNOSIS — Z1231 Encounter for screening mammogram for malignant neoplasm of breast: Secondary | ICD-10-CM

## 2022-02-10 ENCOUNTER — Ambulatory Visit
Admission: RE | Admit: 2022-02-10 | Discharge: 2022-02-10 | Disposition: A | Discharge status: Home / Self Care | Payer: Medicare Other | Source: Ambulatory Visit | Attending: Internal Medicine | Admitting: Internal Medicine

## 2022-02-10 DIAGNOSIS — Z1231 Encounter for screening mammogram for malignant neoplasm of breast: Secondary | ICD-10-CM

## 2022-05-07 ENCOUNTER — Ambulatory Visit: Payer: Medicare Other | Admitting: Obstetrics & Gynecology

## 2022-10-23 IMAGING — MG MM DIGITAL SCREENING BILAT W/ TOMO AND CAD
8 series · 8 of 24 positions shown · non-contrast
Comparison: Previous exam(s).

CLINICAL DATA: Screening.

EXAM:
DIGITAL SCREENING BILATERAL MAMMOGRAM WITH TOMOSYNTHESIS AND CAD
TECHNIQUE: Bilateral screening digital craniocaudal and mediolateral oblique
mammograms were obtained. Bilateral screening digital breast
tomosynthesis was performed. The images were evaluated with
computer-aided detection.

[L MLO synth-2D]
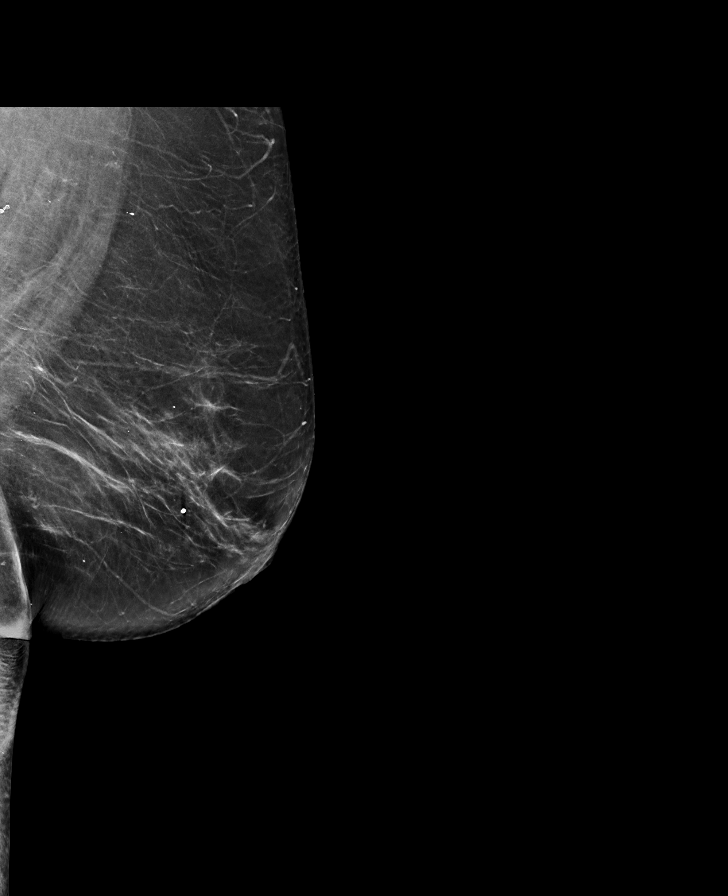

[L CC synth-2D]
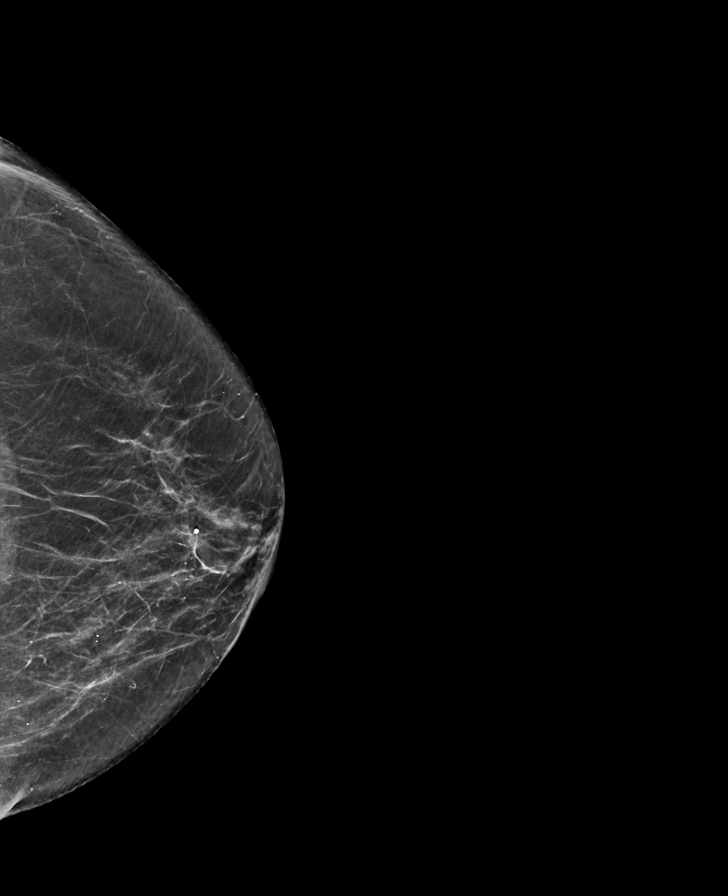

[R MLO synth-2D]
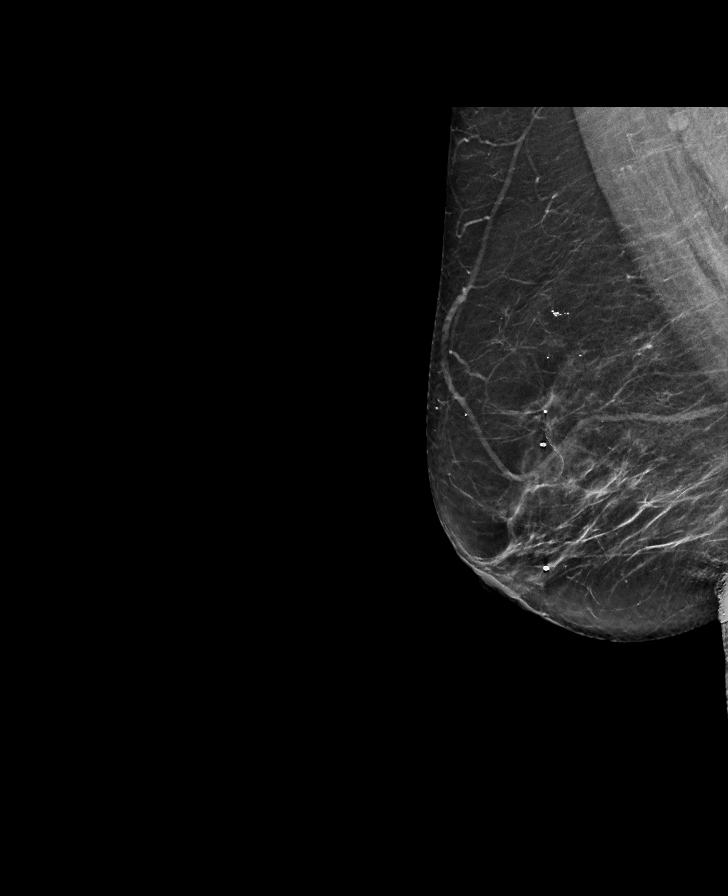

[R CC synth-2D]
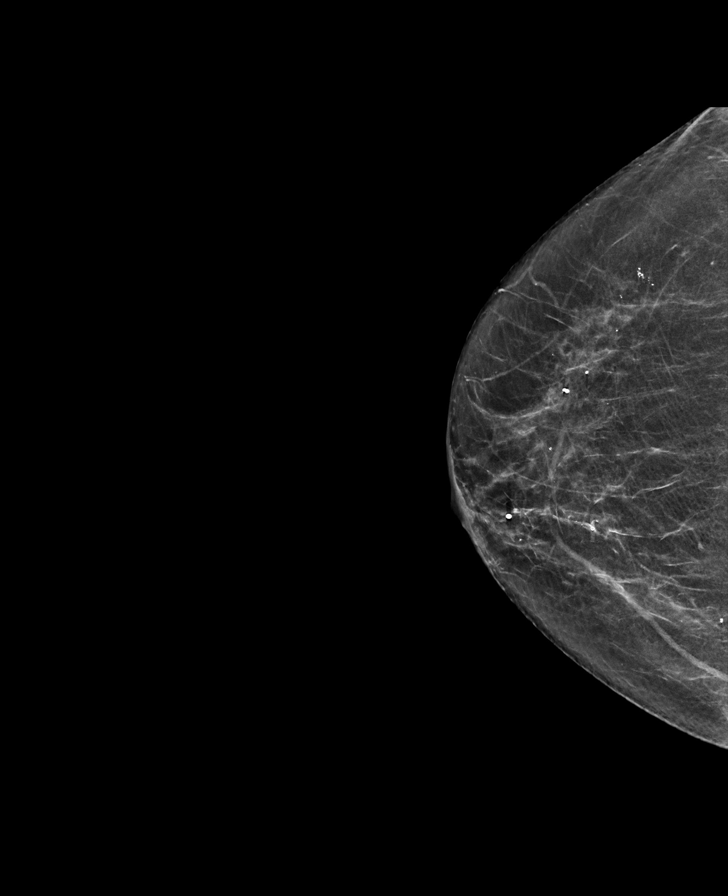

[L CC tomo · tomo slice 31/61.0]
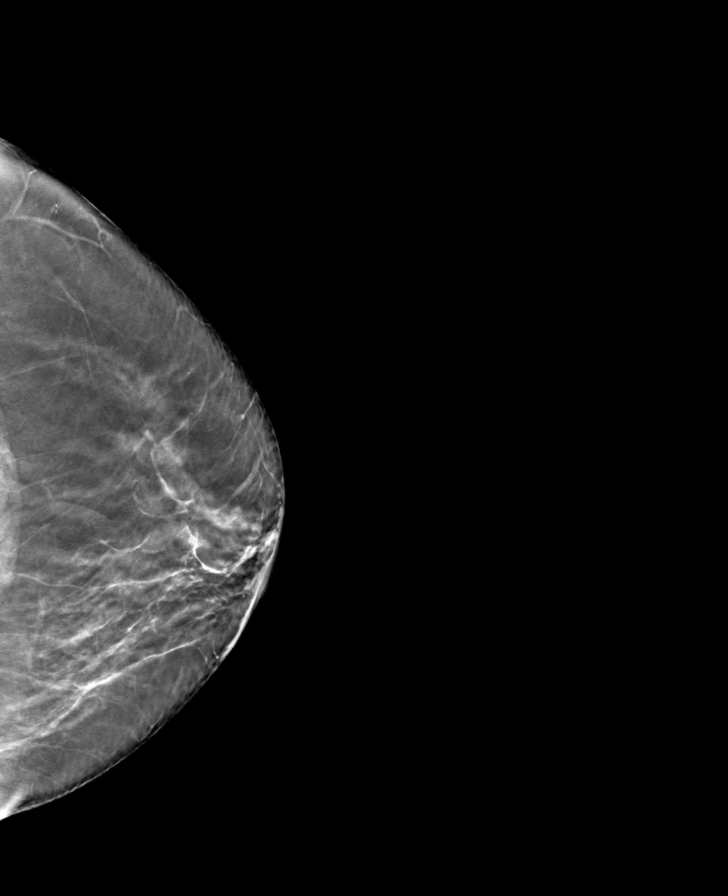

[L MLO tomo · tomo slice 37/72.0]
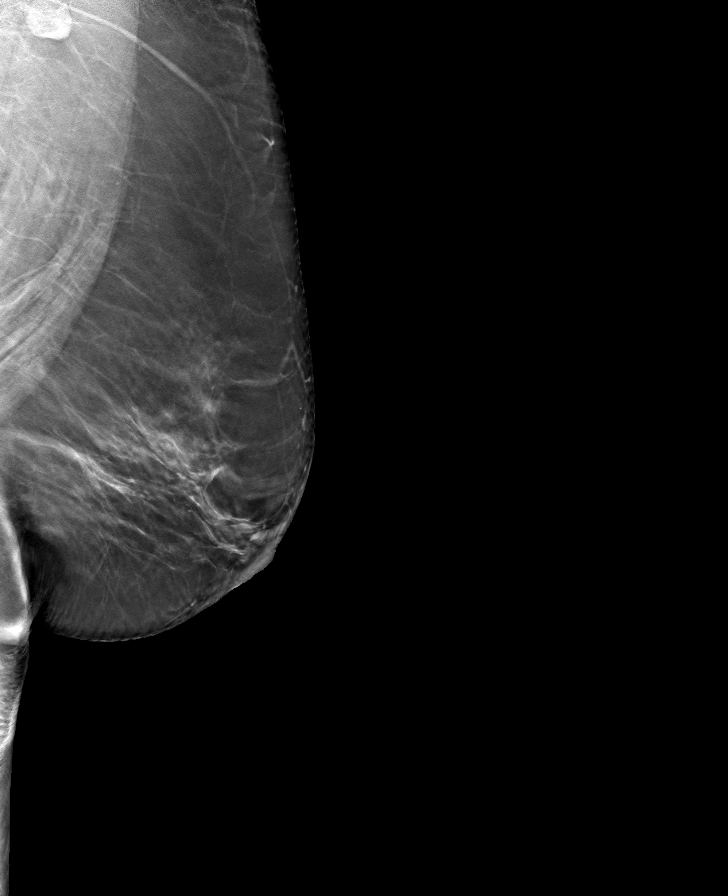

[R MLO tomo · tomo slice 33/65.0]
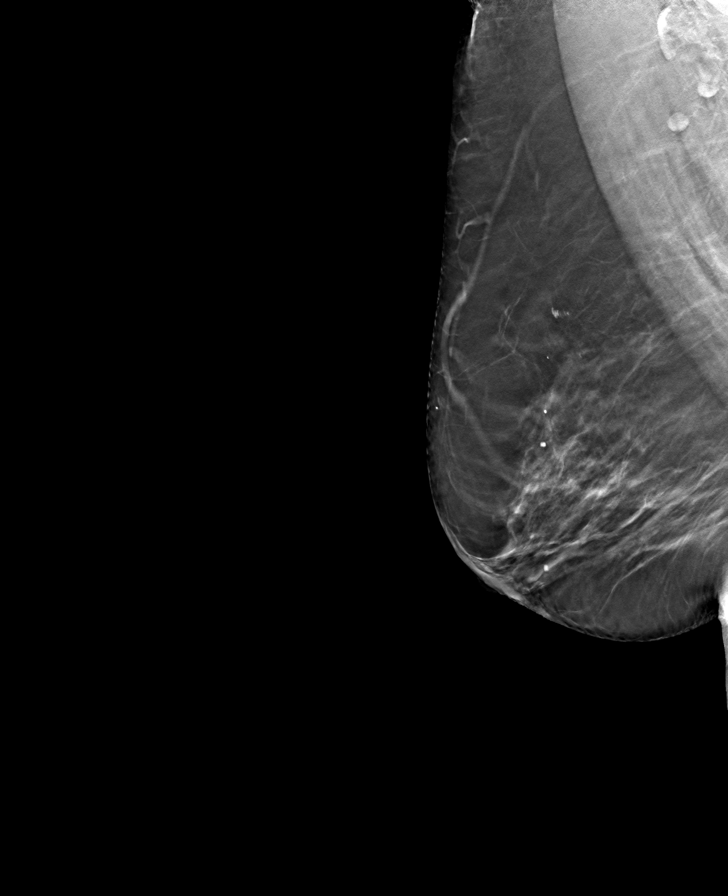

[R CC tomo · tomo slice 29/57.0]
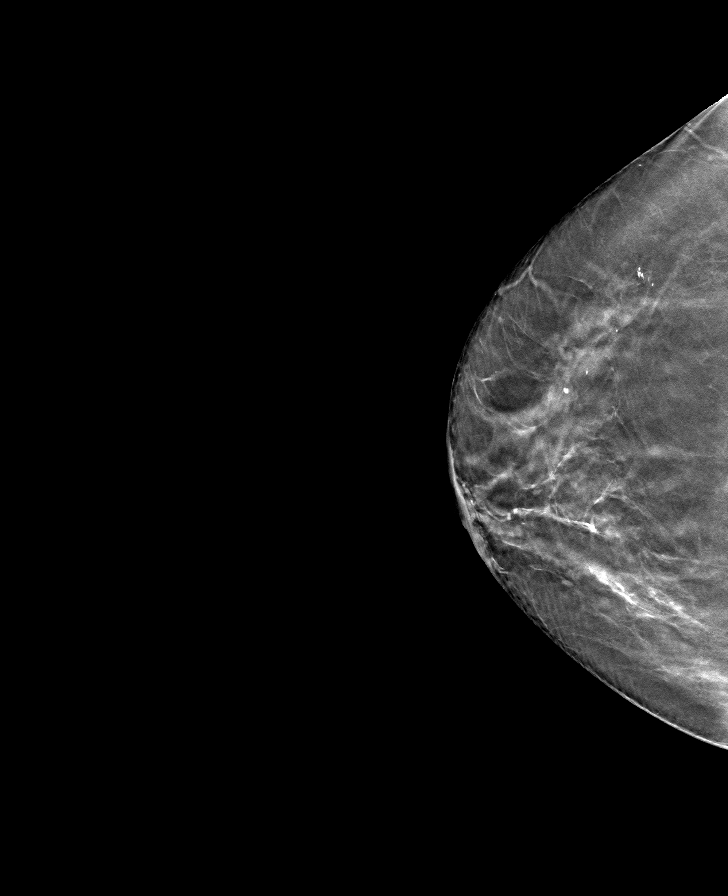

[8 of 24 positions shown; findings below may reference images not displayed]

ACR Breast Density Category b: There are scattered areas of
fibroglandular density.
FINDINGS: There are no findings suspicious for malignancy.
IMPRESSION: No mammographic evidence of malignancy. A result letter of this
screening mammogram will be mailed directly to the patient.

RECOMMENDATION:
Screening mammogram in one year. (Code:[id number])

BI-RADS CATEGORY  1: Negative.
